# Patient Record
Sex: Male | Born: 1960
Health system: Southern US, Community
[De-identification: ages and names within clinical notes are randomized; demographics above are authoritative.]

## PROBLEM LIST (undated history)

## (undated) DIAGNOSIS — N181 Chronic kidney disease, stage 1: Secondary | ICD-10-CM

## (undated) DIAGNOSIS — I4729 Other ventricular tachycardia: Secondary | ICD-10-CM

## (undated) DIAGNOSIS — S39012A Strain of muscle, fascia and tendon of lower back, initial encounter: Secondary | ICD-10-CM

## (undated) DIAGNOSIS — J309 Allergic rhinitis, unspecified: Secondary | ICD-10-CM

## (undated) DIAGNOSIS — H409 Unspecified glaucoma: Secondary | ICD-10-CM

## (undated) DIAGNOSIS — H35719 Central serous chorioretinopathy, unspecified eye: Secondary | ICD-10-CM

## (undated) DIAGNOSIS — G5600 Carpal tunnel syndrome, unspecified upper limb: Secondary | ICD-10-CM

## (undated) DIAGNOSIS — R809 Proteinuria, unspecified: Secondary | ICD-10-CM

## (undated) DIAGNOSIS — M5432 Sciatica, left side: Secondary | ICD-10-CM

## (undated) DIAGNOSIS — R9431 Abnormal electrocardiogram [ECG] [EKG]: Secondary | ICD-10-CM

## (undated) DIAGNOSIS — I472 Ventricular tachycardia: Secondary | ICD-10-CM

## (undated) HISTORY — DX: Ventricular tachycardia: I47.2

## (undated) HISTORY — DX: Chronic kidney disease, stage 1: N18.1

## (undated) HISTORY — DX: Other ventricular tachycardia: I47.29

## (undated) HISTORY — DX: Central serous chorioretinopathy, unspecified eye: H35.719

## (undated) HISTORY — DX: Strain of muscle, fascia and tendon of lower back, initial encounter: S39.012A

## (undated) HISTORY — DX: Sciatica, left side: M54.32

## (undated) HISTORY — DX: Abnormal electrocardiogram (ECG) (EKG): R94.31

## (undated) HISTORY — DX: Allergic rhinitis, unspecified: J30.9

## (undated) HISTORY — DX: Proteinuria, unspecified: R80.9

## (undated) HISTORY — DX: Unspecified glaucoma: H40.9

## (undated) HISTORY — DX: Carpal tunnel syndrome, unspecified upper limb: G56.00

---

## 2013-06-11 HISTORY — PX: COLONOSCOPY: SHX174

## 2017-03-20 ENCOUNTER — Other Ambulatory Visit: Payer: Self-pay

## 2017-03-20 DIAGNOSIS — I10 Essential (primary) hypertension: Secondary | ICD-10-CM

## 2017-03-20 DIAGNOSIS — I517 Cardiomegaly: Secondary | ICD-10-CM

## 2017-03-20 DIAGNOSIS — R9431 Abnormal electrocardiogram [ECG] [EKG]: Secondary | ICD-10-CM

## 2017-03-20 HISTORY — DX: Cardiomegaly: I51.7

## 2017-03-20 HISTORY — DX: Essential (primary) hypertension: I10

## 2017-03-20 HISTORY — DX: Abnormal electrocardiogram (ECG) (EKG): R94.31

## 2017-03-23 ENCOUNTER — Ambulatory Visit: Payer: Federal, State, Local not specified - PPO | Admitting: Medical

## 2017-03-24 ENCOUNTER — Encounter: Payer: Self-pay | Admitting: Family Medicine

## 2017-03-24 ENCOUNTER — Ambulatory Visit (INDEPENDENT_AMBULATORY_CARE_PROVIDER_SITE_OTHER): Payer: Medicare Other | Admitting: Family Medicine

## 2017-03-24 VITALS — BP 122/78 | HR 45 | Temp 97.8°F | Ht 75.0 in | Wt 193.5 lb

## 2017-03-24 DIAGNOSIS — J4520 Mild intermittent asthma, uncomplicated: Secondary | ICD-10-CM

## 2017-03-24 NOTE — Patient Instructions (Addendum)
Pneumococcal vaccine (PCV23) is indicated for all people after turning 65. You would get it at this age because of your diagnosis of asthma.   Come to your next appointment fasting (9-12 hours).   Let us know if you need anything.

## 2017-03-24 NOTE — Progress Notes (Signed)
Chief Complaint  Patient presents with  . Establish Care       New Patient Visit SUBJECTIVE: HPI: Justin Frazier is an 57 y.o.male who is being seen for establishing care.  The patient was previously seen in [place].  He has a history of mild intermittent asthma and allergies.  He takes montelukast and albuterol as needed.  He ends up taking albuterol around once every 3 months.  He does not take any maintenance inhalers.  He has never had a pneumonia vaccine.  He does get the flu shot yearly.  He has a history of NSVT seen by the cardiology team.  His medicine is stable.  No Known Allergies  Past Medical History:  Diagnosis Date  . Abnormal EKG 03/20/2017  . Allergic rhinitis   . Carpal tunnel syndrome   . Central serous retinopathy   . CKD (chronic kidney disease), stage I   . Glaucoma   . Lumbar strain   . NSVT (nonsustained ventricular tachycardia) ([place])    Seen by Dr. Mina Marble (EP) recommended beta blocker therapy (2014)  . Proteinuria   . Sciatica of left side    Past Surgical History:  Procedure Laterality Date  . COLONOSCOPY  06/11/2013   Social History   Socioeconomic History  . Marital status: Married  Tobacco Use  . Smoking status: Never Smoker  . Smokeless tobacco: Never Used  Substance and Sexual Activity  . Alcohol use: No    Frequency: Never   History reviewed. No pertinent family history.   Current Outpatient Medications:  .  albuterol (PROVENTIL HFA;VENTOLIN HFA) 108 (90 Base) MCG/ACT inhaler, Inhale 2 puffs into the lungs every 6 (six) hours as needed. Every 4-6 hours as needed, Disp: , Rfl:  .  aspirin EC 81 MG tablet, Take 162 mg by mouth daily., Disp: , Rfl:  .  Cholecalciferol 5000 units TABS, Take 5,000 mg by mouth daily., Disp: , Rfl:  .  dorzolamide-timolol (COSOPT) 22.3-6.8 MG/ML ophthalmic solution, Place 1 drop into both eyes 2 (two) times daily., Disp: , Rfl:  .  lisinopril (PRINIVIL,ZESTRIL) 10 MG tablet, Take 10 mg by mouth daily., Disp: ,  Rfl:  .  metoprolol succinate (TOPROL-XL) 25 MG 24 hr tablet, Take 25 mg by mouth daily., Disp: , Rfl:  .  montelukast (SINGULAIR) 10 MG tablet, Take 10 mg by mouth at bedtime., Disp: , Rfl:  .  travoprost, benzalkonium, (TRAVATAN) 0.004 % ophthalmic solution, Place 1 drop into both eyes at bedtime., Disp: , Rfl:  .  traMADol (ULTRAM) 50 MG tablet, Take 2 tablets by mouth every 6 (six) hours as needed. Max of 4 tablets daily, Disp: , Rfl:  .  triamcinolone (NASACORT) 55 MCG/ACT AERO nasal inhaler, Place 2 sprays into the nose daily., Disp: , Rfl:   ROS Cardiovascular: Denies chest pain  Respiratory: Denies dyspnea   OBJECTIVE: BP 122/78 (BP Location: Left Arm, Patient Position: Sitting, Cuff Size: Large)   Pulse (!) 45   Temp 97.8 F (36.6 C) (Oral)   Ht 6\' 3"  (1.905 m)   Wt 193 lb 8 oz (87.8 kg)   SpO2 98%   BMI 24.19 kg/m   Constitutional: -  VS reviewed -  Well developed, well nourished, appears stated age -  No apparent distress  Psychiatric: -  Oriented to person, place, and time -  Memory intact -  Affect and mood normal -  Fluent conversation, good eye contact -  Judgment and insight age appropriate  Eye: -  Conjunctivae clear, no discharge -  Pupils symmetric, round, reactive to light  ENMT: -  MMM    Pharynx moist, no exudate, no erythema  Neck: -  No gross swelling, no palpable masses -  Thyroid midline, not enlarged, mobile, no palpable masses  Cardiovascular: -  RRR -  No LE edema  Respiratory: -  Normal respiratory effort, no accessory muscle use, no retraction -  Breath sounds equal, no wheezes, no ronchi, no crackles  Skin: -  No significant lesion on inspection -  Warm and dry to palpation   ASSESSMENT/PLAN: Mild intermittent asthma without complication   Patient instructed to sign release of records form from his previous PCP. Continue current management.  Recommended immunization with pneumococcal vaccine.  The patient would like to research it before  agreeing to this.   Patient should return at his earliest convenience for CPE, fasting. The patient voiced understanding and agreement to the plan.   [place], DO 03/24/17  9:53 AM

## 2017-03-24 NOTE — Progress Notes (Signed)
Pre visit review using our clinic review tool, if applicable. No additional management support is needed unless otherwise documented below in the visit note. 

## 2017-03-27 ENCOUNTER — Ambulatory Visit: Payer: Federal, State, Local not specified - PPO | Admitting: Cardiology

## 2017-03-30 ENCOUNTER — Encounter: Payer: Self-pay | Admitting: Family Medicine

## 2017-03-30 ENCOUNTER — Ambulatory Visit (INDEPENDENT_AMBULATORY_CARE_PROVIDER_SITE_OTHER): Payer: Medicare Other | Admitting: Cardiology

## 2017-03-30 ENCOUNTER — Encounter: Payer: Self-pay | Admitting: Cardiology

## 2017-03-30 ENCOUNTER — Ambulatory Visit (INDEPENDENT_AMBULATORY_CARE_PROVIDER_SITE_OTHER): Payer: Medicare Other | Admitting: Family Medicine

## 2017-03-30 ENCOUNTER — Other Ambulatory Visit: Payer: Self-pay | Admitting: Family Medicine

## 2017-03-30 VITALS — BP 128/82 | HR 48 | Temp 97.6°F | Ht 75.0 in | Wt 192.2 lb

## 2017-03-30 VITALS — BP 128/72 | HR 44 | Ht 75.0 in | Wt 192.0 lb

## 2017-03-30 DIAGNOSIS — R9431 Abnormal electrocardiogram [ECG] [EKG]: Secondary | ICD-10-CM

## 2017-03-30 DIAGNOSIS — I10 Essential (primary) hypertension: Secondary | ICD-10-CM | POA: Diagnosis not present

## 2017-03-30 DIAGNOSIS — I517 Cardiomegaly: Secondary | ICD-10-CM | POA: Diagnosis not present

## 2017-03-30 DIAGNOSIS — I472 Ventricular tachycardia: Secondary | ICD-10-CM

## 2017-03-30 DIAGNOSIS — Z125 Encounter for screening for malignant neoplasm of prostate: Secondary | ICD-10-CM

## 2017-03-30 DIAGNOSIS — Z0000 Encounter for general adult medical examination without abnormal findings: Secondary | ICD-10-CM

## 2017-03-30 DIAGNOSIS — D696 Thrombocytopenia, unspecified: Secondary | ICD-10-CM

## 2017-03-30 DIAGNOSIS — I4729 Other ventricular tachycardia: Secondary | ICD-10-CM | POA: Insufficient documentation

## 2017-03-30 DIAGNOSIS — D72819 Decreased white blood cell count, unspecified: Secondary | ICD-10-CM

## 2017-03-30 DIAGNOSIS — R0600 Dyspnea, unspecified: Secondary | ICD-10-CM

## 2017-03-30 DIAGNOSIS — R0609 Other forms of dyspnea: Secondary | ICD-10-CM

## 2017-03-30 HISTORY — DX: Other forms of dyspnea: R06.09

## 2017-03-30 HISTORY — DX: Encounter for general adult medical examination without abnormal findings: Z00.00

## 2017-03-30 HISTORY — DX: Dyspnea, unspecified: R06.00

## 2017-03-30 LAB — PSA: PSA: 2.08 ng/mL (ref 0.10–4.00)

## 2017-03-30 LAB — POC URINALSYSI DIPSTICK (AUTOMATED)
Bilirubin, UA: NEGATIVE
Glucose, UA: NEGATIVE
Ketones, UA: NEGATIVE
Leukocytes, UA: NEGATIVE
NITRITE UA: NEGATIVE
PH UA: 6 (ref 5.0–8.0)
PROTEIN UA: NEGATIVE
RBC UA: NEGATIVE
Urobilinogen, UA: 0.2 E.U./dL

## 2017-03-30 LAB — CBC
HCT: 44.6 % (ref 39.0–52.0)
HEMOGLOBIN: 14.9 g/dL (ref 13.0–17.0)
MCHC: 33.3 g/dL (ref 30.0–36.0)
MCV: 94 fl (ref 78.0–100.0)
Platelets: 125 10*3/uL — ABNORMAL LOW (ref 150.0–400.0)
RBC: 4.75 Mil/uL (ref 4.22–5.81)
RDW: 13.5 % (ref 11.5–15.5)
WBC: 3.8 10*3/uL — ABNORMAL LOW (ref 4.0–10.5)

## 2017-03-30 LAB — COMPREHENSIVE METABOLIC PANEL
ALBUMIN: 4.1 g/dL (ref 3.5–5.2)
ALT: 7 U/L (ref 0–53)
AST: 15 U/L (ref 0–37)
Alkaline Phosphatase: 57 U/L (ref 39–117)
BILIRUBIN TOTAL: 0.8 mg/dL (ref 0.2–1.2)
BUN: 14 mg/dL (ref 6–23)
CO2: 28 mEq/L (ref 19–32)
CREATININE: 1.26 mg/dL (ref 0.40–1.50)
Calcium: 8.9 mg/dL (ref 8.4–10.5)
Chloride: 107 mEq/L (ref 96–112)
GFR: 75.91 mL/min (ref >60.00)
Glucose, Bld: 95 mg/dL (ref 70–99)
Potassium: 3.8 mEq/L (ref 3.5–5.1)
Sodium: 141 mEq/L (ref 135–145)
Total Protein: 6.8 g/dL (ref 6.0–8.3)

## 2017-03-30 LAB — LIPID PANEL
CHOLESTEROL: 174 mg/dL (ref 0–200)
HDL: 42.5 mg/dL (ref >39.00)
LDL Cholesterol: 113 mg/dL — ABNORMAL HIGH (ref 0–99)
NONHDL: 131.48
Total CHOL/HDL Ratio: 4
Triglycerides: 90 mg/dL (ref 0.0–149.0)
VLDL: 18 mg/dL (ref 0.0–40.0)

## 2017-03-30 MED ORDER — TRAMADOL HCL 50 MG PO TABS: 100.0000 mg | ORAL_TABLET | Freq: Four times a day (QID) | ORAL | 0 refills | Status: DC | PRN

## 2017-03-30 MED ORDER — LISINOPRIL 10 MG PO TABS: 10.0000 mg | ORAL_TABLET | Freq: Every day | ORAL | 3 refills | Status: DC

## 2017-03-30 NOTE — Patient Instructions (Signed)
Medication Instructions:  Your physician recommends that you continue on your current medications as directed. Please refer to the Current Medication list given to you today.  Labwork: None  Testing/Procedures: Your physician has recommended that you wear a holter monitor. Holter monitors are medical devices that record the heart's electrical activity. Doctors most often use these monitors to diagnose arrhythmias. Arrhythmias are problems with the speed or rhythm of the heartbeat. The monitor is a small, portable device. You can wear one while you do your normal daily activities. This is usually used to diagnose what is causing palpitations/syncope (passing out).  Your physician has requested that you have an echocardiogram. Echocardiography is a painless test that uses sound waves to create images of your heart. It provides your doctor with information about the size and shape of your heart and how well your heart's chambers and valves are working. This procedure takes approximately one hour. There are no restrictions for this procedure.  Your physician has requested that you have a lexiscan myoview. For further information please visit [email or web address]. Please follow instruction sheet, as given.  Follow-Up: Your physician recommends that you schedule a follow-up appointment in: 1 month  Any Other Special Instructions Will Be Listed Below (If Applicable).     If you need a refill on your cardiac medications before your next appointment, please call your pharmacy.   [place], RN, BSN

## 2017-03-30 NOTE — Patient Instructions (Addendum)
Aim to do some physical exertion for 150 minutes per week. This is typically divided into 5 days per week, 30 minutes per day. The activity should be enough to get your heart rate up. Anything is better than nothing if you have time constraints.  Check out Yoga on Youtube to help with your back and general health. Try to do it 3 times per week.   Give [place] 1-2 days to get the results of your labs back.

## 2017-03-30 NOTE — Progress Notes (Signed)
Pre visit review using our clinic review tool, if applicable. No additional management support is needed unless otherwise documented below in the visit note. 

## 2017-03-30 NOTE — Addendum Note (Signed)
Addended by: Caffie Pinto on: 03/30/2017 12:26 PM   Modules accepted: Orders

## 2017-03-30 NOTE — Progress Notes (Signed)
Chief Complaint  Patient presents with  . Annual Exam    Well Male Justin Frazier is here for a complete physical.   His last physical was >1 year ago.  Current diet: in general, a "healthy" diet   Current exercise: walking Weight trend: stable Does pt snore? Sometimes. Daytime fatigue? No.   Seat belt? Yes.    Health maintenance Colonoscopy- Yes - 2 years ago Tetanus- Yes HIV- Yes - Summer Hep C- Yes - Summer Prostate cancer screening- Yes   Past Medical History:  Diagnosis Date  . Abnormal EKG 03/20/2017  . Allergic rhinitis   . Carpal tunnel syndrome   . Central serous retinopathy   . CKD (chronic kidney disease), stage I   . Glaucoma   . Lumbar strain   . NSVT (nonsustained ventricular tachycardia) ([name])    Seen by Dr. Mina Marble (EP) recommended beta blocker therapy (2014)  . Proteinuria   . Sciatica of left side     Past Surgical History:  Procedure Laterality Date  . COLONOSCOPY  06/11/2013   Medications  Current Outpatient Medications on File Prior to Visit  Medication Sig Dispense Refill  . albuterol (PROVENTIL HFA;VENTOLIN HFA) 108 (90 Base) MCG/ACT inhaler Inhale 2 puffs into the lungs every 6 (six) hours as needed. Every 4-6 hours as needed    . aspirin EC 81 MG tablet Take 162 mg by mouth daily.    . Cholecalciferol 5000 units TABS Take 5,000 mg by mouth daily.    . dorzolamide-timolol (COSOPT) 22.3-6.8 MG/ML ophthalmic solution Place 1 drop into both eyes 2 (two) times daily.    Marland Kitchen lisinopril (PRINIVIL,ZESTRIL) 10 MG tablet Take 10 mg by mouth daily.    . metoprolol succinate (TOPROL-XL) 25 MG 24 hr tablet Take 25 mg by mouth daily.    . montelukast (SINGULAIR) 10 MG tablet Take 10 mg by mouth at bedtime.    . traMADol (ULTRAM) 50 MG tablet Take 2 tablets by mouth every 6 (six) hours as needed. Max of 4 tablets daily    . travoprost, benzalkonium, (TRAVATAN) 0.004 % ophthalmic solution Place 1 drop into both eyes at bedtime.    . triamcinolone (NASACORT) 55  MCG/ACT AERO nasal inhaler Place 2 sprays into the nose daily.     Allergies No Known Allergies Family History History reviewed. No pertinent family history.  Review of Systems: Constitutional:  no fevers or chills Eye:  no recent significant change in vision Ear/Nose/Mouth/Throat:  Ears:  no hearing loss Nose/Mouth/Throat:  no complaints of nasal congestion or bleeding, no sore throat Cardiovascular:  no chest pain, no palpitations Respiratory:  no cough and no shortness of breath Gastrointestinal:  no abdominal pain, no change in bowel habits, no nausea, vomiting, diarrhea, or constipation and no black or bloody stool GU:  Male: negative for dysuria, frequency, and incontinence and negative for prostate symptoms Musculoskeletal/Extremities:  no pain, redness, or swelling of the joints Integumentary (Skin/Breast):  no abnormal skin lesions reported Neurologic:  no headaches Endocrine: No unexpected weight changes Hematologic/Lymphatic:  no abnormal bleeding  Exam BP 128/82 (BP Location: Left Arm, Patient Position: Sitting, Cuff Size: Normal)   Pulse (!) 48   Temp 97.6 F (36.4 C) (Oral)   Ht 6\' 3"  (1.905 m)   Wt 192 lb 4 oz (87.2 kg)   SpO2 98%   BMI 24.03 kg/m  General:  well developed, well nourished, in no apparent distress Skin:  no significant moles, warts, or growths Head:  no masses, lesions,  or tenderness Eyes:  pupils equal and round, sclera anicteric without injection Ears:  canals without lesions, TMs shiny without retraction, no obvious effusion, no erythema Nose:  nares patent, septum midline, mucosa normal Throat/Pharynx:  lips and gingiva without lesion; tongue and uvula midline; non-inflamed pharynx; no exudates or postnasal drainage Neck: neck supple without adenopathy, thyromegaly, or masses Lungs:  clear to auscultation, breath sounds equal bilaterally, no respiratory distress Cardio:  regular rate and rhythm without murmurs, heart sounds without clicks or  rubs Abdomen:  abdomen soft, nontender; bowel sounds normal; no masses or organomegaly Genital (male): circumcised penis, no lesions or discharge; testes present bilaterally without masses or tenderness Rectal: Deferred Musculoskeletal:  symmetrical muscle groups noted without atrophy or deformity Extremities:  no clubbing, cyanosis, or edema, no deformities, no skin discoloration Neuro:  gait normal; deep tendon reflexes normal and symmetric Psych: well oriented with normal range of affect and appropriate judgment/insight  Assessment and Plan  Well adult exam - Plan: Lipid panel, CBC, Comprehensive metabolic panel, Urinalysis, Routine w reflex microscopic  Screening for malignant neoplasm of prostate - Plan: PSA   Well 57 y.o. male. Counseled on diet and exercise. Yoga.  Counseled on risks and benefits of prostate cancer screening with PSA. The patient agrees to undergo testing. Immunizations, labs, and further orders as above. Follow up in 1 yr or prn. The patient voiced understanding and agreement to the plan.  [place], DO 03/30/17 8:23 AM

## 2017-03-30 NOTE — Progress Notes (Signed)
Cardiology Office Note:    Date:  03/30/2017   ID:  Justin Frazier, DOB [birth year], MRN [id number]  PCP:  [name], DO  Cardiologist:  Jenean Lindau, MD   Referring MD: No ref. provider found    ASSESSMENT:    1. Hypertension, unspecified type   2. NSVT (nonsustained ventricular tachycardia) ([place])   3. LVH (left ventricular hypertrophy)   4. Abnormal EKG    PLAN:    In order of problems listed above:  1. Primary prevention stressed with the patient.  Importance of compliance with diet and medications stressed and he vocalized understanding.  He has had all blood work done recently and we will try to evaluate it when we have the results.  Hopefully this will be at his next appointment in a month.\ 2. Patient has abnormal EKG and history of nonsustained ventricular tachycardia.  We will do a echocardiogram and exercise Cardiolite.  He has been advised to withhold his beta-blocker on the morning of the test.  48-hour Holter monitoring will be done in view of history of nonsustained ventricular tachycardia.  Patient has never had symptoms of sustained palpitations or syncope. 3. 1 month follow-up appointment or earlier if he has any concerns.   Medication Adjustments/Labs and Tests Ordered: Current medicines are reviewed at length with the patient today.  Concerns regarding medicines are outlined above.  Orders Placed This Encounter  Procedures  . HOLTER MONITOR - 48 HOUR  . MYOCARDIAL PERFUSION IMAGING  . EKG 12-Lead  . ECHOCARDIOGRAM COMPLETE   No orders of the defined types were placed in this encounter.    History of Present Illness:    Justin Frazier is a 57 y.o. male who is being seen today for the evaluation of abnormal EKG, history of left ventricular hypertrophy and nonsustained ventricular tachycardia at the request of her primary care physician.  Patient is a pleasant 57 year old male.  He has past medical history of essential hypertension.  He  is retired and now leads a sedentary lifestyle.  No chest pain orthopnea or PND.  His EKG is markedly abnormal with history of an abnormal EKG in the past.  He has left ventricular hypertrophy on the EKG.  He has history of nonsustained ventricular tachycardia.  He leads a sedentary lifestyle.  He is evaluated for the same at this time.  He denies any history of palpitations or syncope.  Only on and off he feels a skipped beat which is been going on for the past several years.  At the time of my evaluation, the patient is alert awake oriented and in no distress.  Past Medical History:  Diagnosis Date  . Abnormal EKG 03/20/2017  . Allergic rhinitis   . Carpal tunnel syndrome   . Central serous retinopathy   . CKD (chronic kidney disease), stage I   . Glaucoma   . Lumbar strain   . NSVT (nonsustained ventricular tachycardia) ([place])    Seen by Dr. Mina Marble (EP) recommended beta blocker therapy (2014)  . Proteinuria   . Sciatica of left side     Past Surgical History:  Procedure Laterality Date  . COLONOSCOPY  06/11/2013    Current Medications: Current Meds  Medication Sig  . albuterol (PROVENTIL HFA;VENTOLIN HFA) 108 (90 Base) MCG/ACT inhaler Inhale 2 puffs into the lungs every 6 (six) hours as needed. Every 4-6 hours as needed  . aspirin EC 81 MG tablet Take 162 mg by mouth daily.  . Cholecalciferol 5000 units  TABS Take 5,000 mg by mouth daily.  . dorzolamide-timolol (COSOPT) 22.3-6.8 MG/ML ophthalmic solution Place 1 drop into both eyes 2 (two) times daily.  Marland Kitchen lisinopril (PRINIVIL,ZESTRIL) 10 MG tablet Take 1 tablet (10 mg total) by mouth daily.  . metoprolol succinate (TOPROL-XL) 25 MG 24 hr tablet Take 25 mg by mouth daily.  . montelukast (SINGULAIR) 10 MG tablet Take 10 mg by mouth at bedtime.  . traMADol (ULTRAM) 50 MG tablet Take 2 tablets (100 mg total) by mouth every 6 (six) hours as needed. Max of 4 tablets daily  . travoprost, benzalkonium, (TRAVATAN) 0.004 % ophthalmic solution  Place 1 drop into both eyes at bedtime.  . triamcinolone (NASACORT) 55 MCG/ACT AERO nasal inhaler Place 2 sprays into the nose daily.     Allergies:   Patient has no known allergies.   Social History   Socioeconomic History  . Marital status: Married    Spouse name: None  . Number of children: None  . Years of education: None  . Highest education level: None  Social Needs  . Financial resource strain: None  . Food insecurity - worry: None  . Food insecurity - inability: None  . Transportation needs - medical: None  . Transportation needs - non-medical: None  Occupational History  . None  Tobacco Use  . Smoking status: Never Smoker  . Smokeless tobacco: Never Used  Substance and Sexual Activity  . Alcohol use: No    Frequency: Never  . Drug use: None  . Sexual activity: None  Other Topics Concern  . None  Social History Narrative  . None     Family History: The patient's family history is not on file.  ROS:   Please see the history of present illness.    All other systems reviewed and are negative.  EKGs/Labs/Other Studies Reviewed:    The following studies were reviewed today: EKG done revealed sinus rhythm and left ventricular hypertrophy.  Records from his primary care doctor and other cardiology evaluation in the past were reviewed.   Recent Labs: No results found for requested labs within last 8760 hours.  Recent Lipid Panel No results found for: CHOL, TRIG, HDL, CHOLHDL, VLDL, LDLCALC, LDLDIRECT  Physical Exam:    VS:  BP 128/72 (BP Location: Right Arm, Patient Position: Sitting, Cuff Size: Normal)   Pulse (!) 44   Ht 6\' 3"  (1.905 m)   Wt 192 lb (87.1 kg)   SpO2 98%   BMI 24.00 kg/m     Wt Readings from Last 3 Encounters:  03/30/17 192 lb (87.1 kg)  03/30/17 192 lb 4 oz (87.2 kg)  03/24/17 193 lb 8 oz (87.8 kg)     GEN: Patient is in no acute distress HEENT: Normal NECK: No JVD; No carotid bruits LYMPHATICS: No lymphadenopathy CARDIAC: S1  S2 regular, 2/6 systolic murmur at the apex. RESPIRATORY:  Clear to auscultation without rales, wheezing or rhonchi  ABDOMEN: Soft, non-tender, non-distended MUSCULOSKELETAL:  No edema; No deformity  SKIN: Warm and dry NEUROLOGIC:  Alert and oriented x 3 PSYCHIATRIC:  Normal affect    Signed, Jenean Lindau, MD  03/30/2017 10:37 AM    [place]

## 2017-03-30 NOTE — Addendum Note (Signed)
Addended by: Sharon Seller B on: 03/30/2017 08:38 AM   Modules accepted: Orders

## 2017-03-31 ENCOUNTER — Ambulatory Visit: Payer: Federal, State, Local not specified - PPO | Admitting: Cardiology

## 2017-03-31 ENCOUNTER — Other Ambulatory Visit (INDEPENDENT_AMBULATORY_CARE_PROVIDER_SITE_OTHER): Payer: Medicare Other

## 2017-03-31 DIAGNOSIS — D691 Qualitative platelet defects: Secondary | ICD-10-CM | POA: Diagnosis not present

## 2017-03-31 DIAGNOSIS — D72819 Decreased white blood cell count, unspecified: Secondary | ICD-10-CM | POA: Diagnosis not present

## 2017-03-31 DIAGNOSIS — D696 Thrombocytopenia, unspecified: Secondary | ICD-10-CM | POA: Diagnosis not present

## 2017-03-31 NOTE — Addendum Note (Signed)
Addended by: Harl Bowie on: 03/31/2017 07:57 AM   Modules accepted: Orders

## 2017-04-03 ENCOUNTER — Other Ambulatory Visit: Payer: Medicare Other

## 2017-04-03 LAB — CBC WITH DIFFERENTIAL/PLATELET
Basophils Absolute: 22 cells/uL (ref 0–200)
Basophils Relative: 0.5 %
EOS PCT: 1.1 %
Eosinophils Absolute: 48 cells/uL (ref 15–500)
HEMATOCRIT: 43.9 % (ref 38.5–50.0)
Hemoglobin: 14.6 g/dL (ref 13.2–17.1)
LYMPHS ABS: 1751 {cells}/uL (ref 850–3900)
MCH: 30.2 pg (ref 27.0–33.0)
MCHC: 33.3 g/dL (ref 32.0–36.0)
MCV: 90.7 fL (ref 80.0–100.0)
MONOS PCT: 9 %
MPV: 11.8 fL (ref 7.5–12.5)
NEUTROS ABS: 2182 {cells}/uL (ref 1500–7800)
NEUTROS PCT: 49.6 %
PLATELETS: 131 10*3/uL — AB (ref 140–400)
RBC: 4.84 10*6/uL (ref 4.20–5.80)
RDW: 12.5 % (ref 11.0–15.0)
Total Lymphocyte: 39.8 %
WBC mixed population: 396 cells/uL (ref 200–950)
WBC: 4.4 10*3/uL (ref 3.8–10.8)

## 2017-04-03 LAB — PATHOLOGIST SMEAR REVIEW

## 2017-04-04 ENCOUNTER — Encounter: Payer: Self-pay | Admitting: Family Medicine

## 2017-04-05 ENCOUNTER — Other Ambulatory Visit: Payer: Self-pay | Admitting: Family Medicine

## 2017-04-05 ENCOUNTER — Telehealth (HOSPITAL_COMMUNITY): Payer: Self-pay | Admitting: *Deleted

## 2017-04-05 DIAGNOSIS — D691 Qualitative platelet defects: Secondary | ICD-10-CM

## 2017-04-05 NOTE — Telephone Encounter (Signed)
Patient given detailed instructions per Myocardial Perfusion Study Information Sheet for the test on 04/11/17 Patient notified to arrive 15 minutes early and that it is imperative to arrive on time for appointment to keep from having the test rescheduled.  If you need to cancel or reschedule your appointment, please call the office within 24 hours of your appointment. . Patient verbalized understanding.Justin Frazier Jacqueline    

## 2017-04-10 ENCOUNTER — Telehealth: Payer: Self-pay | Admitting: *Deleted

## 2017-04-10 NOTE — Telephone Encounter (Signed)
Received Medical records from [facility], [place]; forwarded to provider/SLS 02/25

## 2017-04-11 ENCOUNTER — Ambulatory Visit (INDEPENDENT_AMBULATORY_CARE_PROVIDER_SITE_OTHER): Payer: Medicare Other

## 2017-04-11 ENCOUNTER — Other Ambulatory Visit: Payer: Self-pay

## 2017-04-11 ENCOUNTER — Ambulatory Visit (HOSPITAL_BASED_OUTPATIENT_CLINIC_OR_DEPARTMENT_OTHER): Payer: Medicare Other

## 2017-04-11 ENCOUNTER — Ambulatory Visit (HOSPITAL_COMMUNITY): Payer: Medicare Other | Attending: Cardiovascular Disease

## 2017-04-11 DIAGNOSIS — I080 Rheumatic disorders of both mitral and aortic valves: Secondary | ICD-10-CM | POA: Insufficient documentation

## 2017-04-11 DIAGNOSIS — I10 Essential (primary) hypertension: Secondary | ICD-10-CM

## 2017-04-11 DIAGNOSIS — I472 Ventricular tachycardia: Secondary | ICD-10-CM

## 2017-04-11 DIAGNOSIS — I4729 Other ventricular tachycardia: Secondary | ICD-10-CM

## 2017-04-11 LAB — MYOCARDIAL PERFUSION IMAGING
CHL CUP NUCLEAR SSS: 2
CHL CUP RESTING HR STRESS: 39 {beats}/min
CHL RATE OF PERCEIVED EXERTION: 18
CSEPED: 9 min
Estimated workload: 10.1 METS
LV dias vol: 141 mL (ref 62–150)
LV sys vol: 75 mL
MPHR: 164 {beats}/min
Peak HR: 151 {beats}/min
Percent HR: 92 %
RATE: 0.26
SDS: 2
SRS: 0
TID: 1.01

## 2017-04-11 MED ORDER — TECHNETIUM TC 99M TETROFOSMIN IV KIT
10.2000 | PACK | Freq: Once | INTRAVENOUS | Status: AC | PRN
  Administered 2017-04-11: 10.2 via INTRAVENOUS
  Filled 2017-04-11: qty 11

## 2017-04-11 MED ORDER — TECHNETIUM TC 99M TETROFOSMIN IV KIT
32.6000 | PACK | Freq: Once | INTRAVENOUS | Status: AC | PRN
  Administered 2017-04-11: 32.6 via INTRAVENOUS
  Filled 2017-04-11: qty 33

## 2017-04-11 MED ORDER — PERFLUTREN LIPID MICROSPHERE
1.0000 mL | INTRAVENOUS | Status: AC | PRN
  Administered 2017-04-11: 2 mL via INTRAVENOUS

## 2017-04-13 ENCOUNTER — Telehealth: Payer: Self-pay | Admitting: Family Medicine

## 2017-04-13 NOTE — Telephone Encounter (Signed)
01/2016 CBC- WBC's WNL (4.4k), Plt low 129k, Hb WNL 15 g/dL Tdap 02/01/16  OK to recheck as originally planned for now. If WBC is abn, will refer.

## 2017-04-21 ENCOUNTER — Other Ambulatory Visit: Payer: Self-pay

## 2017-04-21 DIAGNOSIS — I472 Ventricular tachycardia, unspecified: Secondary | ICD-10-CM

## 2017-04-27 ENCOUNTER — Ambulatory Visit: Payer: Medicare Other | Admitting: Cardiology

## 2017-05-30 ENCOUNTER — Encounter: Payer: Self-pay | Admitting: Cardiology

## 2017-06-09 ENCOUNTER — Ambulatory Visit (HOSPITAL_COMMUNITY)
Admission: RE | Admit: 2017-06-09 | Discharge: 2017-06-09 | Disposition: A | Discharge status: Home / Self Care | Payer: Medicare Other | Source: Ambulatory Visit | Attending: Cardiology | Admitting: Cardiology

## 2017-06-09 DIAGNOSIS — I472 Ventricular tachycardia: Secondary | ICD-10-CM | POA: Diagnosis not present

## 2017-06-09 DIAGNOSIS — I080 Rheumatic disorders of both mitral and aortic valves: Secondary | ICD-10-CM | POA: Insufficient documentation

## 2017-06-09 DIAGNOSIS — I517 Cardiomegaly: Secondary | ICD-10-CM | POA: Insufficient documentation

## 2017-06-09 DIAGNOSIS — R9431 Abnormal electrocardiogram [ECG] [EKG]: Secondary | ICD-10-CM | POA: Diagnosis not present

## 2017-06-09 LAB — CREATININE, SERUM
CREATININE: 1.27 mg/dL — AB (ref 0.61–1.24)
GFR calc Af Amer: >60 mL/min (ref >60)

## 2017-06-09 MED ORDER — GADOBENATE DIMEGLUMINE 529 MG/ML IV SOLN
30.0000 mL | Freq: Once | INTRAVENOUS | Status: AC | PRN
  Administered 2017-06-09: 30 mL via INTRAVENOUS

## 2017-06-14 ENCOUNTER — Other Ambulatory Visit: Payer: Self-pay

## 2017-06-14 DIAGNOSIS — I517 Cardiomegaly: Secondary | ICD-10-CM

## 2017-06-22 ENCOUNTER — Institutional Professional Consult (permissible substitution): Payer: Medicare Other | Admitting: Internal Medicine

## 2017-06-27 ENCOUNTER — Encounter: Payer: Self-pay | Admitting: Cardiology

## 2017-06-27 ENCOUNTER — Ambulatory Visit (INDEPENDENT_AMBULATORY_CARE_PROVIDER_SITE_OTHER): Payer: Medicare Other | Admitting: Cardiology

## 2017-06-27 ENCOUNTER — Encounter (INDEPENDENT_AMBULATORY_CARE_PROVIDER_SITE_OTHER): Payer: Self-pay

## 2017-06-27 ENCOUNTER — Other Ambulatory Visit: Payer: Self-pay | Admitting: Family Medicine

## 2017-06-27 VITALS — BP 136/92 | HR 51 | Ht 75.0 in | Wt 187.0 lb

## 2017-06-27 DIAGNOSIS — I493 Ventricular premature depolarization: Secondary | ICD-10-CM

## 2017-06-27 DIAGNOSIS — I10 Essential (primary) hypertension: Secondary | ICD-10-CM | POA: Diagnosis not present

## 2017-06-27 DIAGNOSIS — I422 Other hypertrophic cardiomyopathy: Secondary | ICD-10-CM

## 2017-06-27 MED ORDER — METOPROLOL SUCCINATE ER 25 MG PO TB24: 25.0000 mg | ORAL_TABLET | Freq: Every day | ORAL | 2 refills | Status: DC

## 2017-06-27 NOTE — Patient Instructions (Signed)
Medication Instructions:  Your physician recommends that you continue on your current medications as directed. Please refer to the Current Medication list given to you today.  Labwork: None ordered  Testing/Procedures: None ordered  Follow-Up: You have been referred to our genetic counselor, Dr. Broadus John  Your physician recommends that you schedule a follow-up appointment in: 3 months with Dr. Curt Bears.   * If you need a refill on your cardiac medications before your next appointment, please call your pharmacy.   *Please note that any paperwork needing to be filled out by the provider will need to be addressed at the front desk prior to seeing the provider. Please note that any FMLA, disability or other documents regarding health condition is subject to a $25.00 charge that must be received prior to completion of paperwork in the form of a money order or check.  Thank you for choosing [facility]!!   Trinidad Curet, RN [phone]  Any Other Special Instructions Will Be Listed Below (If Applicable).   Hypertrophic Cardiomyopathy Hypertrophic cardiomyopathy (HCM) is a heart condition in which part of the heart muscle gets too thick. The condition can cause a dangerous and abnormal heart rhythm. It can also weaken the heart over time. The heart is divided into four chambers. The thickening usually affects the pumping chamber on the lower left (left ventricle). HCM may also affect the valve that lets blood flow out of the left ventricle (mitral valve). Symptoms often begin at about age 61. What are the causes? This condition is usually caused by abnormal genes that control heart muscle growth. These abnormal genes are passed down through families (are inherited). What increases the risk? This condition is more likely to develop in people with a family history of HCM. What are the signs or symptoms? Symptoms of this condition include:  Shortness of breath, especially after  exercising or lying down.  Chest pain.  Dizziness.  Fatigue.  Irregular or fast heart rate.  Fainting, especially after physical activity.  How is this diagnosed? This condition is diagnosed based on the results of a physical exam that involves checking for an abnormal heart sound (heart murmur). Tests may be done, including:  An electrocardiogram (ECG). This test records your heart's electrical activity.  An echocardiogram. This test can show whether your left ventricle is enlarged and whether it fills slowly.  A Doppler test. This test shows irregular blood flow and pressure differences inside the heart.  A chest X-ray. This test can show whether your heart is enlarged.  An MRI.  How is this treated? Treatment for HCM depends on how severe your symptoms are. There are several options for treatment, including.  Medicine. Medicines can be given to: ? Reduce the workload of your heart. ? Lower your blood pressure. ? Thin your blood and prevent clots.  A device. Devices that can be used to treat this condition include: ? A pacemaker. This device helps to control your heartbeat. ? A defibrillator. This device restores a normal heart rhythm.  Surgery. This may include a procedure to: ? Inject alcohol into the small blood vessels that supply your heart muscle (alcohol septal ablation). This is done during a procedure called cardiac catheterization. The goal is to cause the muscle to become thinner. ? Remove part of the wall that divides the right and left sides of the heart (septum). ? Replace the mitral valve.  Follow these instructions at home:  Avoid strenuous exercise and activities, like heavy lifting or shoveling snow.  Make sure the members of your household know how to do CPR in case of an emergency.  Take over-the-counter and prescription medicines only as told by your health care provider.  Follow a healthy diet and maintain a healthy weight. If you need help  with losing weight, ask your health care provider.  Limit alcohol intake to no more than 1 drink per day for nonpregnant women and 2 drinks per day for men. One drink equals 12 oz of beer, 5 oz of wine, or 1 oz of hard liquor.  Do not use any products that contain nicotine or tobacco, such as cigarettes and e-cigarettes. If you need help quitting, ask your health care provider.  Keep all follow-up visits as directed by your health care provider. This is important. Contact a health care provider if:  You have new symptoms.  Your symptoms get worse. Get help right away if:  You have chest pain or shortness of breath, especially during or after sports.  You feel faint or you pass out.  You have trouble breathing even at rest.  Your feet or ankles swell.  Your heartbeat seems irregular or seems faster than normal (palpitations).  Your heartbeat seems abnormal. This information is not intended to replace advice given to you by your health care provider. Make sure you discuss any questions you have with your health care provider. Document Released: 01/07/2004 Document Revised: 12/25/2015 Document Reviewed: 12/25/2015 Elsevier Interactive Patient Education  2018 [employer].

## 2017-06-27 NOTE — Progress Notes (Signed)
Electrophysiology Office Note   Date:  06/27/2017   ID:  [name], DOB 1960-11-01, MRN [id number]  PCP:  [name], DO  Cardiologist:  Revankar Primary Electrophysiologist:  Jmarion Christiano Meredith Leeds, MD    Chief Complaint  Patient presents with  . Advice Only    NSVT     History of Present Illness: HASHIM EICHHORST is a 57 y.o. male who is being seen today for the evaluation of hypertrophic cardia myopathy at the request of [name]. Presenting today for electrophysiology evaluation.  He has a history of CKD stage I, hypertension, LVH, and nonsustained VT on beta-blockers.  He has occasional palpitations on and off for the last several years.  He stopped caffeine and his palpitations went away.  He has not had any episodes of syncope, near syncope.  He does not have a family history of sudden death.  Wore a cardiac monitor that did not show sustained arrhythmias.  He does not have an LV apical aneurysm.  He does have a daughter.    Today, he denies symptoms of palpitations, chest pain, shortness of breath, orthopnea, PND, lower extremity edema, claudication, dizziness, presyncope, syncope, bleeding, or neurologic sequela. The patient is tolerating medications without difficulties.    Past Medical History:  Diagnosis Date  . Abnormal EKG 03/20/2017  . Allergic rhinitis   . Carpal tunnel syndrome   . Central serous retinopathy   . CKD (chronic kidney disease), stage I   . Glaucoma   . Lumbar strain   . NSVT (nonsustained ventricular tachycardia) ([place])    Seen by Dr. Mina Marble (EP) recommended beta blocker therapy (2014)  . Proteinuria   . Sciatica of left side    Past Surgical History:  Procedure Laterality Date  . COLONOSCOPY  06/11/2013     Current Outpatient Medications  Medication Sig Dispense Refill  . albuterol (PROVENTIL HFA;VENTOLIN HFA) 108 (90 Base) MCG/ACT inhaler Inhale 2 puffs into the lungs every 6 (six) hours as needed. Every 4-6 hours  as needed    . aspirin EC 81 MG tablet Take 162 mg by mouth daily.    . Cholecalciferol 5000 units TABS Take 5,000 mg by mouth daily.    . dorzolamide-timolol (COSOPT) 22.3-6.8 MG/ML ophthalmic solution Place 1 drop into both eyes 2 (two) times daily.    Marland Kitchen lisinopril (PRINIVIL,ZESTRIL) 10 MG tablet Take 1 tablet (10 mg total) by mouth daily. 90 tablet 3  . metoprolol succinate (TOPROL-XL) 25 MG 24 hr tablet Take 25 mg by mouth daily.    . montelukast (SINGULAIR) 10 MG tablet Take 10 mg by mouth at bedtime.    . traMADol (ULTRAM) 50 MG tablet Take 50 mg by mouth every 6 (six) hours as needed. Max of 4 tablets daily    . travoprost, benzalkonium, (TRAVATAN) 0.004 % ophthalmic solution Place 1 drop into both eyes at bedtime.    . triamcinolone (NASACORT) 55 MCG/ACT AERO nasal inhaler Place 2 sprays into the nose daily.     No current facility-administered medications for this visit.     Allergies:   Patient has no known allergies.   Social History:  The patient  reports that he has never smoked. He has never used smokeless tobacco. He reports that he does not drink alcohol.   Family History:  The patient's family history includes Alzheimer's disease in his mother; Hypertension in his father; Liver cancer in his father.    ROS:  Please see the history of present illness.  Otherwise, review of systems is positive for back pain.   All other systems are reviewed and negative.    PHYSICAL EXAM: VS:  BP (!) 136/92   Pulse (!) 51   Ht 6\' 3"  (1.905 m)   Wt 187 lb (84.8 kg)   SpO2 96%   BMI 23.37 kg/m  , BMI Body mass index is 23.37 kg/m. GEN: Well nourished, well developed, in no acute distress  HEENT: normal  Neck: no JVD, carotid bruits, or masses Cardiac: RRR; no murmurs, rubs, or gallops,no edema  Respiratory:  clear to auscultation bilaterally, normal work of breathing GI: soft, nontender, nondistended, + BS MS: no deformity or atrophy  Skin: warm and dry Neuro:  Strength and  sensation are intact Psych: euthymic mood, full affect  EKG:  EKG is not ordered today. Personal review of the ekg ordered shows sinus rhythm, LVH with repol abnormality  Recent Labs: 03/30/2017: ALT 7; BUN 14; Potassium 3.8; Sodium 141 03/31/2017: Hemoglobin 14.6; Platelets 131 06/09/2017: Creatinine, Ser 1.27    Lipid Panel     Component Value Date/Time   CHOL 174 03/30/2017 0824   TRIG 90.0 03/30/2017 0824   HDL 42.50 03/30/2017 0824   CHOLHDL 4 03/30/2017 0824   VLDL 18.0 03/30/2017 0824   LDLCALC 113 (H) 03/30/2017 0824     Wt Readings from Last 3 Encounters:  06/27/17 187 lb (84.8 kg)  04/11/17 192 lb (87.1 kg)  03/30/17 192 lb (87.1 kg)      Other studies Reviewed: Additional studies/ records that were reviewed today include: TTE 04/11/17  Review of the above records today demonstrates:  - Left ventricle: The cavity size was normal. There was mild   concentric hypertrophy with moderate apical hypertrophy measuring   1.5 cm. Systolic function was normal. The estimated ejection   fraction was in the range of 60% to 65%. Wall motion was normal;   there were no regional wall motion abnormalities. Features are   consistent with a pseudonormal left ventricular filling pattern,   with concomitant abnormal relaxation and increased filling   pressure (grade 2 diastolic dysfunction). Doppler parameters are   consistent with indeterminate ventricular filling pressure. - Aortic valve: Transvalvular velocity was within the normal range.   There was no stenosis. There was mild regurgitation. - Mitral valve: Transvalvular velocity was within the normal range.   There was no evidence for stenosis. There was mild regurgitation. - Left atrium: The atrium was moderately to severely dilated. - Right ventricle: The cavity size was normal. Wall thickness was   normal. Systolic function was normal. - Atrial septum: No defect or patent foramen ovale was identified. - Tricuspid valve:  There was trivial regurgitation.  SPECT 04/11/17  Nuclear stress EF: 47%.  There was no ST segment deviation noted during stress.  This is a low risk study.  The left ventricular ejection fraction is mildly decreased (45-54%).   Normal resting and stress perfusion. No ischemia or infarction EF Estimated 47% with apical dyskinesis Suggest echo correlation For apical HOCM given ECG appearance   Holter 04/19/17 - personally reviewed Baseline rhythm: Sinus Minimum heart rate: 40 BPM.  Average heart rate: 63 BPM.  Maximal heart rate 118 BPM. Symptoms: Patient had multiple episodes of fluttering sensation and this did not correlate with any significant arrhythmias  Conclusion:  Abnormal Holter monitoring.  Patient has baseline abnormal EKG with deep inverted T waves.  Occasional PACs and 715 PVCs in the monitoring with one 4 beat NSVT  CMRI  06/13/17 1. Mildly dilated left ventricle with severely thickened apical segments (up to 19 mm) with a typical spade-shape appearance of the left ventricular cavity. Basal segments have mild hypertrophy. Normal systolic function (LVEF = 64%). There are no regional wall motion abnormalities. There is diffuse late gadolinium enhancement in all apical segments.  2. Normal right ventricular size, thickness and systolic function (LVEF = 53%). There are no regional wall motion abnormalities.  3.  Moderately dilated left atrium.  4.  Mild aortic and mitral regurgitation.  5. Normal pericardium. Trivial pericardial effusion located posteriorly to the heart.  Collectively, these findings are consistent with an apical form of hypertrophic cardiomyopathy with high risk feature - diffuse late gadolinium enhancement in all 4 apical segments.  ASSESSMENT AND PLAN:  1.  Apical hypertrophic cardiomyopathy: Normal EF, though with a 19 mm apex.  Also has diffuse late gadolinium and all apical segments.  Plan to discuss the MRI results with his reading  cardiologist.  Fortunately for him, he does not have any high risk features such as sustained ventricular arrhythmias, family history of sudden death, arrhythmic syncope, nonsustained VT, or LVH greater than 30 mm.  Should he develop these, he would likely need an ICD.  In the interim, we Jolynne Spurgin refer him to genetic counseling.  2.  Hypertension: Blood pressure well controlled today and has been well controlled in the past.  No changes.  3.  Nonsustained VT: 3 and 4 beat runs on his monitor.  He has not had anything longer lasting than that.  He would likely need to have 7 beats or greater to qualify for an ICD.  Current medicines are reviewed at length with the patient today.   The patient does not have concerns regarding his medicines.  The following changes were made today:  none  Labs/ tests ordered today include:  No orders of the defined types were placed in this encounter.  Case discussed with primary cardiology Disposition:   FU with Dane Bloch 3 months  Signed, Diallo Ponder Meredith Leeds, MD  06/27/2017 3:17 PM     [facility] [address] [place] [address] [phone] (office) [phone] (fax)

## 2017-07-03 ENCOUNTER — Other Ambulatory Visit (INDEPENDENT_AMBULATORY_CARE_PROVIDER_SITE_OTHER): Payer: Medicare Other

## 2017-07-03 DIAGNOSIS — D691 Qualitative platelet defects: Secondary | ICD-10-CM | POA: Diagnosis not present

## 2017-07-03 LAB — CBC WITH DIFFERENTIAL/PLATELET
BASOS ABS: 0 10*3/uL (ref 0.0–0.1)
Basophils Relative: 0.5 % (ref 0.0–3.0)
EOS PCT: 1.6 % (ref 0.0–5.0)
Eosinophils Absolute: 0.1 10*3/uL (ref 0.0–0.7)
HCT: 42.6 % (ref 39.0–52.0)
Hemoglobin: 14.2 g/dL (ref 13.0–17.0)
Lymphocytes Relative: 44.5 % (ref 12.0–46.0)
Lymphs Abs: 1.8 10*3/uL (ref 0.7–4.0)
MCHC: 33.3 g/dL (ref 30.0–36.0)
MCV: 94.8 fl (ref 78.0–100.0)
MONOS PCT: 10.2 % (ref 3.0–12.0)
Monocytes Absolute: 0.4 10*3/uL (ref 0.1–1.0)
NEUTROS ABS: 1.7 10*3/uL (ref 1.4–7.7)
Neutrophils Relative %: 43.2 % (ref 43.0–77.0)
PLATELETS: 131 10*3/uL — AB (ref 150.0–400.0)
RBC: 4.49 Mil/uL (ref 4.22–5.81)
RDW: 13.9 % (ref 11.5–15.5)
WBC: 4 10*3/uL (ref 4.0–10.5)

## 2017-07-06 ENCOUNTER — Encounter: Payer: Medicare Other | Admitting: Genetic Counselor

## 2017-07-26 ENCOUNTER — Other Ambulatory Visit: Payer: Self-pay

## 2017-07-26 DIAGNOSIS — I421 Obstructive hypertrophic cardiomyopathy: Secondary | ICD-10-CM

## 2017-08-10 ENCOUNTER — Encounter: Payer: Medicare Other | Admitting: Genetic Counselor

## 2017-08-23 ENCOUNTER — Ambulatory Visit (INDEPENDENT_AMBULATORY_CARE_PROVIDER_SITE_OTHER): Payer: Medicare Other | Admitting: Family

## 2017-08-23 ENCOUNTER — Encounter: Payer: Self-pay | Admitting: Family

## 2017-08-23 ENCOUNTER — Other Ambulatory Visit: Payer: Self-pay | Admitting: Family

## 2017-08-23 VITALS — BP 127/76 | HR 67 | Temp 98.4°F | Resp 16 | Ht 75.0 in | Wt 192.6 lb

## 2017-08-23 DIAGNOSIS — T63441A Toxic effect of venom of bees, accidental (unintentional), initial encounter: Secondary | ICD-10-CM

## 2017-08-23 MED ORDER — MONTELUKAST SODIUM 10 MG PO TABS: 10.0000 mg | ORAL_TABLET | Freq: Every day | ORAL | 2 refills | Status: DC

## 2017-08-23 MED ORDER — EPINEPHRINE 0.3 MG/0.3ML IJ SOAJ: 0.3000 mg | Freq: Once | INTRAMUSCULAR | 0 refills | Status: DC

## 2017-08-23 MED ORDER — METHYLPREDNISOLONE SODIUM SUCC 125 MG IJ SOLR
125.0000 mg | Freq: Once | INTRAMUSCULAR | Status: AC
  Administered 2017-08-23: 125 mg via INTRAMUSCULAR

## 2017-08-23 MED ORDER — PREDNISONE 10 MG PO TABS: ORAL_TABLET | ORAL | 0 refills | Status: DC

## 2017-08-23 NOTE — Progress Notes (Signed)
Subjective:    Patient ID: Justin Frazier, male    DOB: 1960/05/03, 57 y.o.   MRN: [id number]  HPI  Patient is a 57 yr old male who presents today with chief complaint of swelling of hand following bee sting yesterday.  Was moving pine straw to obscure cords leading to his outdoor lighting and exposed a yellow jacket nest. Reports that he was wearing gardening gloves and was stung multiple times through the gloves. Reports that he has been taking benadryl without improvement in the swelling. Left hand is pruritic. He denies tongue/lip swelling or shortness of breath.    Review of Systems    see HPI  Past Medical History:  Diagnosis Date  . Abnormal EKG 03/20/2017  . Allergic rhinitis   . Carpal tunnel syndrome   . Central serous retinopathy   . CKD (chronic kidney disease), stage I   . Glaucoma   . Lumbar strain   . NSVT (nonsustained ventricular tachycardia) ([place])    Seen by Dr. Mina Marble (EP) recommended beta blocker therapy (2014)  . Proteinuria   . Sciatica of left side      Social History   Socioeconomic History  . Marital status: Married    Spouse name: Not on file  . Number of children: Not on file  . Years of education: Not on file  . Highest education level: Not on file  Occupational History  . Not on file  Social Needs  . Financial resource strain: Not on file  . Food insecurity:    Worry: Not on file    Inability: Not on file  . Transportation needs:    Medical: Not on file    Non-medical: Not on file  Tobacco Use  . Smoking status: Never Smoker  . Smokeless tobacco: Never Used  Substance and Sexual Activity  . Alcohol use: No    Frequency: Never  . Drug use: Not on file  . Sexual activity: Not on file  Lifestyle  . Physical activity:    Days per week: Not on file    Minutes per session: Not on file  . Stress: Not on file  Relationships  . Social connections:    Talks on phone: Not on file    Gets together: Not on file    Attends religious  service: Not on file    Active member of club or organization: Not on file    Attends meetings of clubs or organizations: Not on file    Relationship status: Not on file  . Intimate partner violence:    Fear of current or ex partner: Not on file    Emotionally abused: Not on file    Physically abused: Not on file    Forced sexual activity: Not on file  Other Topics Concern  . Not on file  Social History Narrative  . Not on file    Past Surgical History:  Procedure Laterality Date  . COLONOSCOPY  06/11/2013    Family History  Problem Relation Age of Onset  . Alzheimer's disease Mother   . Hypertension Father   . Liver cancer Father     No Known Allergies  Current Outpatient Medications on File Prior to Visit  Medication Sig Dispense Refill  . albuterol (PROVENTIL HFA;VENTOLIN HFA) 108 (90 Base) MCG/ACT inhaler Inhale 2 puffs into the lungs every 6 (six) hours as needed. Every 4-6 hours as needed    . aspirin EC 81 MG tablet Take 162 mg by mouth daily.    Marland Kitchen  Cholecalciferol 5000 units TABS Take 5,000 mg by mouth daily.    . dorzolamide-timolol (COSOPT) 22.3-6.8 MG/ML ophthalmic solution Place 1 drop into both eyes 2 (two) times daily.    Marland Kitchen lisinopril (PRINIVIL,ZESTRIL) 10 MG tablet Take 1 tablet (10 mg total) by mouth daily. 90 tablet 3  . metoprolol succinate (TOPROL-XL) 25 MG 24 hr tablet Take 1 tablet (25 mg total) by mouth daily. 90 tablet 2  . montelukast (SINGULAIR) 10 MG tablet Take 10 mg by mouth at bedtime.    . traMADol (ULTRAM) 50 MG tablet Take 2 tabs every 6 hours as needed for moderate-severe pain. Not to exceed 4 tabs daily. 30 tablet 3  . travoprost, benzalkonium, (TRAVATAN) 0.004 % ophthalmic solution Place 1 drop into both eyes at bedtime.     No current facility-administered medications on file prior to visit.     BP 127/76 (BP Location: Right Arm, Cuff Size: Normal)   Pulse 67   Temp 98.4 F (36.9 C) (Oral)   Resp 16   Ht 6\' 3"  (1.905 m)   Wt 192 lb  9.6 oz (87.4 kg)   SpO2 100%   BMI 24.07 kg/m    Objective:   Physical Exam  Constitutional: He is oriented to person, place, and time. He appears well-developed and well-nourished. No distress.  HENT:  Head: Normocephalic and atraumatic.  Cardiovascular: Normal rate and regular rhythm.  No murmur heard. Pulmonary/Chest: Effort normal and breath sounds normal. No respiratory distress. He has no wheezes. He has no rales.  Musculoskeletal:  + swelling of left hand and wrist  Neurological: He is alert and oriented to person, place, and time.  Skin: Skin is warm and dry.  Psychiatric: He has a normal mood and affect. His behavior is normal. Thought content normal.           Assessment & Plan:  Allergic reaction to bee sting- Rx with IM solumedrol, to be followed by a short burst of oral prednisone.  I did give him an rx for an epipen to keep on hand in case of future worse reaction (tongue/lip/swelling/SOB). We discussed proper administration of epi-pen and need to call 911 if he needs to use.  He verbalizes understanding. He is advised to call if current symptoms worsen or if they are not improved in 3-4 days.

## 2017-08-23 NOTE — Telephone Encounter (Signed)
Initiated PA via covermymeds and received message that medication is covered under pt's plan and for pharmacy to call the helpdesk if problems processing RX. Spoke with pharmacist and gave # [phone] to process RX.

## 2017-08-23 NOTE — Patient Instructions (Signed)
Please begin prednisone tonight. Keep epipen on hand in case of severe allergic reaction in the future (tongue/lip swelling or shortness of breath). Call if new/worsening symptoms or if symptoms are not improved in 2-3 days.

## 2017-09-07 ENCOUNTER — Encounter: Payer: Medicare Other | Admitting: Genetic Counselor

## 2017-09-26 ENCOUNTER — Ambulatory Visit: Payer: Medicare Other | Admitting: Cardiology

## 2017-09-28 ENCOUNTER — Telehealth: Payer: Self-pay | Admitting: Family Medicine

## 2017-09-28 DIAGNOSIS — D696 Thrombocytopenia, unspecified: Secondary | ICD-10-CM

## 2017-09-28 NOTE — Telephone Encounter (Signed)
OK to schedule. TY.

## 2017-09-28 NOTE — Telephone Encounter (Signed)
Copied from [place] [id number]. Topic: Quick Communication - See Telephone Encounter >> Sep 28, 2017  9:17 AM Marja Kays F wrote: Pt is requesting lab work to check is white blood cell count  Best number is [phone]

## 2017-09-29 ENCOUNTER — Other Ambulatory Visit (INDEPENDENT_AMBULATORY_CARE_PROVIDER_SITE_OTHER): Payer: Medicare Other

## 2017-09-29 DIAGNOSIS — D696 Thrombocytopenia, unspecified: Secondary | ICD-10-CM | POA: Diagnosis not present

## 2017-09-29 LAB — CBC WITH DIFFERENTIAL/PLATELET
Basophils Absolute: 0 10*3/uL (ref 0.0–0.1)
Basophils Relative: 0.5 % (ref 0.0–3.0)
EOS ABS: 0.1 10*3/uL (ref 0.0–0.7)
Eosinophils Relative: 1.3 % (ref 0.0–5.0)
HCT: 43.7 % (ref 39.0–52.0)
Hemoglobin: 14.6 g/dL (ref 13.0–17.0)
Lymphocytes Relative: 37.4 % (ref 12.0–46.0)
Lymphs Abs: 1.6 10*3/uL (ref 0.7–4.0)
MCHC: 33.4 g/dL (ref 30.0–36.0)
MCV: 93.5 fl (ref 78.0–100.0)
MONO ABS: 0.5 10*3/uL (ref 0.1–1.0)
Monocytes Relative: 10.7 % (ref 3.0–12.0)
Neutro Abs: 2.2 10*3/uL (ref 1.4–7.7)
Neutrophils Relative %: 50.1 % (ref 43.0–77.0)
Platelets: 134 10*3/uL — ABNORMAL LOW (ref 150.0–400.0)
RBC: 4.67 Mil/uL (ref 4.22–5.81)
RDW: 13.4 % (ref 11.5–15.5)
WBC: 4.4 10*3/uL (ref 4.0–10.5)

## 2017-09-29 NOTE — Telephone Encounter (Signed)
Scheduled appt.

## 2017-11-13 ENCOUNTER — Ambulatory Visit (INDEPENDENT_AMBULATORY_CARE_PROVIDER_SITE_OTHER): Payer: Medicare Other | Admitting: Cardiology

## 2017-11-13 ENCOUNTER — Encounter: Payer: Self-pay | Admitting: Cardiology

## 2017-11-13 VITALS — BP 124/76 | HR 49 | Ht 75.0 in | Wt 189.0 lb

## 2017-11-13 DIAGNOSIS — I493 Ventricular premature depolarization: Secondary | ICD-10-CM | POA: Diagnosis not present

## 2017-11-13 MED ORDER — METOPROLOL SUCCINATE ER 25 MG PO TB24: 25.0000 mg | ORAL_TABLET | Freq: Every day | ORAL | 2 refills | Status: DC

## 2017-11-13 MED ORDER — LISINOPRIL 10 MG PO TABS: 10.0000 mg | ORAL_TABLET | Freq: Every day | ORAL | 2 refills | Status: DC

## 2017-11-13 NOTE — Progress Notes (Signed)
Electrophysiology Office Note   Date:  11/13/2017   ID:  Justin Frazier, DOB 1960-07-22, MRN [id number]  PCP:  [name], DO  Cardiologist:  Frazier Primary Electrophysiologist:  Marrissa Dai Meredith Leeds, MD    No chief complaint on file.    History of Present Illness: Justin Frazier is a 57 y.o. male who is being seen today for the evaluation of hypertrophic cardia myopathy at the request of Justin Frazier. Presenting today for electrophysiology evaluation.  He has a history of CKD stage I, hypertension, LVH, and nonsustained VT on beta-blockers.  He has occasional palpitations on and off for the last several years.  He stopped caffeine and his palpitations went away.  He has not had any episodes of syncope, near syncope.  He does not have a family history of sudden death.  Wore a cardiac monitor that did not show sustained arrhythmias.  He does not have an LV apical aneurysm.  He does have a daughter.  Today, denies symptoms of palpitations, chest pain, shortness of breath, orthopnea, PND, lower extremity edema, claudication, dizziness, presyncope, syncope, bleeding, or neurologic sequela. The patient is tolerating medications without difficulties.  Feeling well not having any issues.  He has no chest pain or shortness of breath.  He is tolerating his medications without issue.   Past Medical History:  Diagnosis Date  . Abnormal EKG 03/20/2017  . Allergic rhinitis   . Carpal tunnel syndrome   . Central serous retinopathy   . CKD (chronic kidney disease), stage I   . Glaucoma   . Lumbar strain   . NSVT (nonsustained ventricular tachycardia) ([place])    Seen by Dr. Mina Marble (EP) recommended beta blocker therapy (2014)  . Proteinuria   . Sciatica of left side    Past Surgical History:  Procedure Laterality Date  . COLONOSCOPY  06/11/2013     Current Outpatient Medications  Medication Sig Dispense Refill  . albuterol (PROVENTIL HFA;VENTOLIN HFA) 108 (90 Base) MCG/ACT  inhaler Inhale 2 puffs into the lungs every 6 (six) hours as needed. Every 4-6 hours as needed    . aspirin EC 81 MG tablet Take 81 mg by mouth daily.     . Cholecalciferol 5000 units TABS Take 5,000 mg by mouth daily.    . dorzolamide-timolol (COSOPT) 22.3-6.8 MG/ML ophthalmic solution Place 1 drop into both eyes 2 (two) times daily.    Marland Kitchen lisinopril (PRINIVIL,ZESTRIL) 10 MG tablet Take 1 tablet (10 mg total) by mouth daily. 90 tablet 2  . metoprolol succinate (TOPROL-XL) 25 MG 24 hr tablet Take 1 tablet (25 mg total) by mouth daily. 90 tablet 2  . montelukast (SINGULAIR) 10 MG tablet Take 1 tablet (10 mg total) by mouth at bedtime. 30 tablet 2  . predniSONE (DELTASONE) 10 MG tablet 4 tabs by mouth once daily for 5 days 20 tablet 0  . traMADol (ULTRAM) 50 MG tablet Take 2 tabs every 6 hours as needed for moderate-severe pain. Not to exceed 4 tabs daily. 30 tablet 3  . travoprost, benzalkonium, (TRAVATAN) 0.004 % ophthalmic solution Place 1 drop into both eyes at bedtime.     No current facility-administered medications for this visit.     Allergies:   Patient has no known allergies.   Social History:  The patient  reports that he has never smoked. He has never used smokeless tobacco. He reports that he does not drink alcohol.   Family History:  The patient's family history includes Alzheimer's disease in  his mother; Hypertension in his father; Liver cancer in his father.   ROS:  Please see the history of present illness.   Otherwise, review of systems is positive for none.   All other systems are reviewed and negative.   PHYSICAL EXAM: VS:  BP 124/76   Pulse (!) 49   Ht 6\' 3"  (1.905 m)   Wt 189 lb (85.7 kg)   BMI 23.62 kg/m  , BMI Body mass index is 23.62 kg/m. GEN: Well nourished, well developed, in no acute distress  HEENT: normal  Neck: no JVD, carotid bruits, or masses Cardiac: RRR; no murmurs, rubs, or gallops,no edema  Respiratory:  clear to auscultation bilaterally, normal  work of breathing GI: soft, nontender, nondistended, + BS MS: no deformity or atrophy  Skin: warm and dry Neuro:  Strength and sensation are intact Psych: euthymic mood, full affect  EKG:  EKG is ordered today. Personal review of the ekg ordered shows sinus rhythm, rate 49, LVH with repolarization abnormalities  Recent Labs: 03/30/2017: ALT 7; BUN 14; Potassium 3.8; Sodium 141 06/09/2017: Creatinine, Ser 1.27 09/29/2017: Hemoglobin 14.6; Platelets 134.0    Lipid Panel     Component Value Date/Time   CHOL 174 03/30/2017 0824   TRIG 90.0 03/30/2017 0824   HDL 42.50 03/30/2017 0824   CHOLHDL 4 03/30/2017 0824   VLDL 18.0 03/30/2017 0824   LDLCALC 113 (H) 03/30/2017 0824     Wt Readings from Last 3 Encounters:  11/13/17 189 lb (85.7 kg)  08/23/17 192 lb 9.6 oz (87.4 kg)  06/27/17 187 lb (84.8 kg)      Other studies Reviewed: Additional studies/ records that were reviewed today include: TTE 04/11/17  Review of the above records today demonstrates:  - Left ventricle: The cavity size was normal. There was mild   concentric hypertrophy with moderate apical hypertrophy measuring   1.5 cm. Systolic function was normal. The estimated ejection   fraction was in the range of 60% to 65%. Wall motion was normal;   there were no regional wall motion abnormalities. Features are   consistent with a pseudonormal left ventricular filling pattern,   with concomitant abnormal relaxation and increased filling   pressure (grade 2 diastolic dysfunction). Doppler parameters are   consistent with indeterminate ventricular filling pressure. - Aortic valve: Transvalvular velocity was within the normal range.   There was no stenosis. There was mild regurgitation. - Mitral valve: Transvalvular velocity was within the normal range.   There was no evidence for stenosis. There was mild regurgitation. - Left atrium: The atrium was moderately to severely dilated. - Right ventricle: The cavity size was  normal. Wall thickness was   normal. Systolic function was normal. - Atrial septum: No defect or patent foramen ovale was identified. - Tricuspid valve: There was trivial regurgitation.  SPECT 04/11/17  Nuclear stress EF: 47%.  There was no ST segment deviation noted during stress.  This is a low risk study.  The left ventricular ejection fraction is mildly decreased (45-54%).   Normal resting and stress perfusion. No ischemia or infarction EF Estimated 47% with apical dyskinesis Suggest echo correlation For apical HOCM given ECG appearance   Holter 04/19/17 - personally reviewed Baseline rhythm: Sinus Minimum heart rate: 40 BPM.  Average heart rate: 63 BPM.  Maximal heart rate 118 BPM. Symptoms: Patient had multiple episodes of fluttering sensation and this did not correlate with any significant arrhythmias  Conclusion:  Abnormal Holter monitoring.  Patient has baseline abnormal EKG with  deep inverted T waves.  Occasional PACs and 715 PVCs in the monitoring with one 4 beat NSVT  CMRI 06/13/17 1. Mildly dilated left ventricle with severely thickened apical segments (up to 19 mm) with a typical spade-shape appearance of the left ventricular cavity. Basal segments have mild hypertrophy. Normal systolic function (LVEF = 64%). There are no regional wall motion abnormalities. There is diffuse late gadolinium enhancement in all apical segments.  2. Normal right ventricular size, thickness and systolic function (LVEF = 53%). There are no regional wall motion abnormalities.  3.  Moderately dilated left atrium.  4.  Mild aortic and mitral regurgitation.  5. Normal pericardium. Trivial pericardial effusion located posteriorly to the heart.  Collectively, these findings are consistent with an apical form of hypertrophic cardiomyopathy with high risk feature - diffuse late gadolinium enhancement in all 4 apical segments.  ASSESSMENT AND PLAN:  1.  Apical hypertrophic  cardiomyopathy: Ejection fraction is normal.  He has apical hypertrophy at 19 mm with diffuse late gadolinium.  I Tayvin Preslar discuss these results with the MRI reader as to his at risk of ventricular arrhythmias.  He has worn a cardiac monitor with minimal ventricular arrhythmias noted.  He has no other high risk features.  We Kalimah Capurro hold off on defibrillator implant at this time.  2.  Hypertension: Controlled.  No changes.  3.  Nonsustained VT: 3 and 4 beat runs noted on cardiac monitor.  Likely lower risk.  Current medicines are reviewed at length with the patient today.   The patient does not have concerns regarding his medicines.  The following changes were made today:  none  Labs/ tests ordered today include:  Orders Placed This Encounter  Procedures  . EKG 12-Lead    Disposition:   FU with Shealee Yordy  months  Signed, Amellia Panik Meredith Leeds, MD  11/13/2017 10:29 AM     [place] [address] [phone] (office) [phone] (fax)

## 2017-11-13 NOTE — Patient Instructions (Addendum)
Medication Instructions:  Your physician recommends that you continue on your current medications as directed. Please refer to the Current Medication list given to you today.  If you need a refill on your cardiac medications before your next appointment, please call your pharmacy.   Labwork: None ordered  Testing/Procedures: None ordered  Follow-Up: Your physician wants you to follow-up in: 6 months with Dr. Camnitz.  You will receive a reminder letter in the mail two months in advance. If you don't receive a letter, please call our office to schedule the follow-up appointment.  Thank you for choosing [facility]!!   Falyn Rubel, RN [phone]         

## 2017-11-14 ENCOUNTER — Other Ambulatory Visit: Payer: Self-pay | Admitting: Family

## 2017-12-05 ENCOUNTER — Ambulatory Visit (INDEPENDENT_AMBULATORY_CARE_PROVIDER_SITE_OTHER): Payer: Medicare Other | Admitting: Medical

## 2017-12-05 ENCOUNTER — Ambulatory Visit (HOSPITAL_BASED_OUTPATIENT_CLINIC_OR_DEPARTMENT_OTHER)
Admission: RE | Admit: 2017-12-05 | Discharge: 2017-12-05 | Disposition: A | Discharge status: Home / Self Care | Payer: Medicare Other | Source: Ambulatory Visit | Attending: Medical | Admitting: Medical

## 2017-12-05 ENCOUNTER — Encounter: Payer: Self-pay | Admitting: Medical

## 2017-12-05 VITALS — BP 133/77 | HR 48 | Temp 98.5°F | Resp 16 | Ht 75.0 in | Wt 187.2 lb

## 2017-12-05 DIAGNOSIS — R0600 Dyspnea, unspecified: Secondary | ICD-10-CM | POA: Diagnosis not present

## 2017-12-05 DIAGNOSIS — R062 Wheezing: Secondary | ICD-10-CM

## 2017-12-05 DIAGNOSIS — K2100 Gastro-esophageal reflux disease with esophagitis, without bleeding: Secondary | ICD-10-CM

## 2017-12-05 DIAGNOSIS — R1013 Epigastric pain: Secondary | ICD-10-CM

## 2017-12-05 DIAGNOSIS — R059 Cough, unspecified: Secondary | ICD-10-CM

## 2017-12-05 DIAGNOSIS — R05 Cough: Secondary | ICD-10-CM | POA: Diagnosis not present

## 2017-12-05 DIAGNOSIS — R0982 Postnasal drip: Secondary | ICD-10-CM

## 2017-12-05 LAB — CBC WITH DIFFERENTIAL/PLATELET
BASOS PCT: 0.8 % (ref 0.0–3.0)
Basophils Absolute: 0 10*3/uL (ref 0.0–0.1)
Eosinophils Absolute: 0.1 10*3/uL (ref 0.0–0.7)
Eosinophils Relative: 1.4 % (ref 0.0–5.0)
HEMATOCRIT: 44.3 % (ref 39.0–52.0)
Hemoglobin: 14.7 g/dL (ref 13.0–17.0)
LYMPHS ABS: 1.9 10*3/uL (ref 0.7–4.0)
LYMPHS PCT: 42.1 % (ref 12.0–46.0)
MCHC: 33.2 g/dL (ref 30.0–36.0)
MCV: 94.5 fl (ref 78.0–100.0)
MONOS PCT: 10.6 % (ref 3.0–12.0)
Monocytes Absolute: 0.5 10*3/uL (ref 0.1–1.0)
NEUTROS ABS: 2 10*3/uL (ref 1.4–7.7)
Neutrophils Relative %: 45.1 % (ref 43.0–77.0)
PLATELETS: 130 10*3/uL — AB (ref 150.0–400.0)
RBC: 4.69 Mil/uL (ref 4.22–5.81)
RDW: 13.9 % (ref 11.5–15.5)
WBC: 4.4 10*3/uL (ref 4.0–10.5)

## 2017-12-05 LAB — COMPREHENSIVE METABOLIC PANEL
ALT: 10 U/L (ref 0–53)
AST: 15 U/L (ref 0–37)
Albumin: 4.2 g/dL (ref 3.5–5.2)
Alkaline Phosphatase: 55 U/L (ref 39–117)
BUN: 14 mg/dL (ref 6–23)
CALCIUM: 9.5 mg/dL (ref 8.4–10.5)
CHLORIDE: 105 meq/L (ref 96–112)
CO2: 29 meq/L (ref 19–32)
Creatinine, Ser: 1.31 mg/dL (ref 0.40–1.50)
GFR: 72.4 mL/min (ref >60.00)
Glucose, Bld: 87 mg/dL (ref 70–99)
Potassium: 4.2 mEq/L (ref 3.5–5.1)
Sodium: 139 mEq/L (ref 135–145)
Total Bilirubin: 0.8 mg/dL (ref 0.2–1.2)
Total Protein: 7.1 g/dL (ref 6.0–8.3)

## 2017-12-05 LAB — LIPASE: Lipase: 30 U/L (ref 11.0–59.0)

## 2017-12-05 LAB — BRAIN NATRIURETIC PEPTIDE: PRO B NATRI PEPTIDE: 79 pg/mL (ref 0.0–100.0)

## 2017-12-05 LAB — AMYLASE: Amylase: 102 U/L (ref 27–131)

## 2017-12-05 LAB — TROPONIN I: TNIDX: 0.03 ug/l (ref 0.00–0.06)

## 2017-12-05 MED ORDER — BECLOMETHASONE DIPROP HFA 40 MCG/ACT IN AERB: 2.0000 | INHALATION_SPRAY | Freq: Two times a day (BID) | RESPIRATORY_TRACT | 0 refills | Status: DC

## 2017-12-05 MED ORDER — LEVOCETIRIZINE DIHYDROCHLORIDE 5 MG PO TABS: 5.0000 mg | ORAL_TABLET | Freq: Every evening | ORAL | 0 refills | Status: DC

## 2017-12-05 MED ORDER — PREDNISONE 10 MG PO TABS: ORAL_TABLET | ORAL | 0 refills | Status: DC

## 2017-12-05 MED ORDER — OMEPRAZOLE 20 MG PO CPDR: 20.0000 mg | DELAYED_RELEASE_CAPSULE | Freq: Every day | ORAL | 3 refills | Status: DC

## 2017-12-05 NOTE — Progress Notes (Signed)
Subjective:    Patient ID: Justin Frazier, male    DOB: April 04, 1960, 57 y.o.   MRN: [id number]  HPI   Pt in for some recent wheezing over past 2 weeks. He has singulair and has inhaler. Pt has been using albuterol about 1-2 times a day. He tries to avoid overusing albuterol. Pt does not smoking. He states stared to wheeze as an adult.  Pt does have some reflux recently. In past used prilosec briefly. Currenlty just using tums. No obvious sneezing or itching eyes. Pt is not diabetic. He does not with walking and active will have wheezing. No swelling of his legs. No recent weight gain.  Pt states the other day when leaning over seat in car felt some transient sharp pain the other day. Points to epigastric and bilateral flank pain. No belching. Not constipated and no diarrhea.  Pt has colonsocopy 3 years ago and told was normal.     Review of Systems  HENT: Positive for postnasal drip. Negative for congestion, ear pain, sinus pressure and sinus pain.   Eyes: Negative for discharge, redness and visual disturbance.  Respiratory: Positive for cough and wheezing.        Some productive cough.  Cardiovascular: Negative for chest pain and palpitations.  Gastrointestinal: Positive for abdominal pain.       See hpi.  Musculoskeletal: Negative for joint swelling and neck pain.  Skin: Negative for rash.  Neurological: Negative for dizziness.  Hematological: Negative for adenopathy. Does not bruise/bleed easily.  Psychiatric/Behavioral: Negative for behavioral problems and confusion.   Past Medical History:  Diagnosis Date  . Abnormal EKG 03/20/2017  . Allergic rhinitis   . Carpal tunnel syndrome   . Central serous retinopathy   . CKD (chronic kidney disease), stage I   . Glaucoma   . Lumbar strain   . NSVT (nonsustained ventricular tachycardia) ([place])    Seen by Dr. Mina Marble (EP) recommended beta blocker therapy (2014)  . Proteinuria   . Sciatica of left side      Social History    Socioeconomic History  . Marital status: Married    Spouse name: Not on file  . Number of children: Not on file  . Years of education: Not on file  . Highest education level: Not on file  Occupational History  . Not on file  Social Needs  . Financial resource strain: Not on file  . Food insecurity:    Worry: Not on file    Inability: Not on file  . Transportation needs:    Medical: Not on file    Non-medical: Not on file  Tobacco Use  . Smoking status: Never Smoker  . Smokeless tobacco: Never Used  Substance and Sexual Activity  . Alcohol use: No    Frequency: Never  . Drug use: Not on file  . Sexual activity: Not on file  Lifestyle  . Physical activity:    Days per week: Not on file    Minutes per session: Not on file  . Stress: Not on file  Relationships  . Social connections:    Talks on phone: Not on file    Gets together: Not on file    Attends religious service: Not on file    Active member of club or organization: Not on file    Attends meetings of clubs or organizations: Not on file    Relationship status: Not on file  . Intimate partner violence:    Fear of current or  ex partner: Not on file    Emotionally abused: Not on file    Physically abused: Not on file    Forced sexual activity: Not on file  Other Topics Concern  . Not on file  Social History Narrative  . Not on file    Past Surgical History:  Procedure Laterality Date  . COLONOSCOPY  06/11/2013    Family History  Problem Relation Age of Onset  . Alzheimer's disease Mother   . Hypertension Father   . Liver cancer Father     No Known Allergies  Current Outpatient Medications on File Prior to Visit  Medication Sig Dispense Refill  . albuterol (PROVENTIL HFA;VENTOLIN HFA) 108 (90 Base) MCG/ACT inhaler Inhale 2 puffs into the lungs every 6 (six) hours as needed. Every 4-6 hours as needed    . aspirin EC 81 MG tablet Take 81 mg by mouth daily.     . Cholecalciferol 5000 units TABS Take  5,000 mg by mouth daily.    . dorzolamide-timolol (COSOPT) 22.3-6.8 MG/ML ophthalmic solution Place 1 drop into both eyes 2 (two) times daily.    Marland Kitchen lisinopril (PRINIVIL,ZESTRIL) 10 MG tablet Take 1 tablet (10 mg total) by mouth daily. 90 tablet 2  . metoprolol succinate (TOPROL-XL) 25 MG 24 hr tablet Take 1 tablet (25 mg total) by mouth daily. 90 tablet 2  . montelukast (SINGULAIR) 10 MG tablet Take 1 tablet (10 mg total) by mouth at bedtime. 30 tablet 2  . predniSONE (DELTASONE) 10 MG tablet 4 tabs by mouth once daily for 5 days 20 tablet 0  . traMADol (ULTRAM) 50 MG tablet Take 2 tabs every 6 hours as needed for moderate-severe pain. Not to exceed 4 tabs daily. 30 tablet 3  . travoprost, benzalkonium, (TRAVATAN) 0.004 % ophthalmic solution Place 1 drop into both eyes at bedtime.     No current facility-administered medications on file prior to visit.     BP 133/77   Pulse (!) 48   Temp 98.5 F (36.9 C) (Oral)   Resp 16   Ht 6\' 3"  (1.905 m)   Wt 187 lb 3.2 oz (84.9 kg)   SpO2 100%   BMI 23.40 kg/m       Objective:   Physical Exam  General  Mental Status - Alert. General Appearance - Well groomed. Not in acute distress.  Skin Rashes- No Rashes.  HEENT Head- Normal. Ear Auditory Canal - Left- Normal. Right - Normal.Tympanic Membrane- Left- Normal. Right- Normal. Eye Sclera/Conjunctiva- Left- Normal. Right- Normal. Nose & Sinuses Nasal Mucosa- Left-  Boggy and Congested. Right-  Boggy and  Congested.Bilateral no  maxillary and no frontal sinus pressure. Mouth & Throat Lips: Upper Lip- Normal: no dryness, cracking, pallor, cyanosis, or vesicular eruption. Lower Lip-Normal: no dryness, cracking, pallor, cyanosis or vesicular eruption. Buccal Mucosa- Bilateral- No Aphthous ulcers. Oropharynx- No Discharge or Erythema. +pnd. Tonsils: Characteristics- Bilateral- No Erythema or Congestion. Size/Enlargement- Bilateral- No enlargement. Discharge- bilateral-None.  Neck Neck-  Supple. No Masses.   Chest and Lung Exam Auscultation: Breath Sounds:-Clear even and unlabored.  Cardiovascular Auscultation:Rythm- Regular, rate and rhythm. Murmurs & Other Heart Sounds:Ausculatation of the heart reveal- No Murmurs.  Lymphatic Head & Neck General Head & Neck Lymphatics: Bilateral: Description- No Localized lymphadenopathy.  Lower ext- no pedal edema.negative homans signs.  Abdomen- faint epigastric tenderness. +bs, no reoubound or guarding. No hernia on exam. No masses.  Back no cva tenderness      Assessment & Plan:  Your wheezing has  worsened over the last 2 weeks, I am prescribing you Qvar steroid inhaler to take daily and still use albuterol if needed.  I do think it is a good idea to keep your reflux/GERD symptoms under control and prescribed omeprazole to take daily.  In addition you might have some allergies recently as you have some obvious postnasal drainage on exam.  Continue Singulair and prescribe Xyzal antihistamine.  Your cough is minimally productive recently.  If it worsens please let me know and I would give you antibiotic.  We will get chest x-ray today, BNP and troponin.  After review of your cardiac work-up recently and echocardiogram results, I do think these test will be beneficial.  We will update you on those results when they are are in.  Also for history of GERD and abdomen discomfort, will get CBC, CMP, amylase lipase and H. pylori breath test.  Follow-up in 7 to 10 days or as needed.    Mackie Pai, PA-C

## 2017-12-05 NOTE — Patient Instructions (Addendum)
Your wheezing has worsened over the last 2 weeks, I am prescribing you Qvar steroid inhaler to take daily and still use albuterol if needed.  I do think it is a good idea to keep your reflux/GERD symptoms under control and prescribed omeprazole to take daily.  In addition you might have some allergies recently as you have some obvious postnasal drainage on exam.  Continue Singulair and prescribe Xyzal antihistamine.  Your cough is minimally productive recently.  If it worsens please let me know and I would give you antibiotic.  We will get chest x-ray today, BNP and troponin.  After review of your cardiac work-up recently and echocardiogram results, I do think these test will be beneficial.  We will update you on those results when they are are in.  Also for history of GERD and abdomen discomfort, will get CBC, CMP, amylase lipase and H. pylori breath test.  Making prednisone available to use if needed/wheezing worsens despite the above measures.  Follow-up in 7 to 10 days or as needed.

## 2017-12-06 ENCOUNTER — Other Ambulatory Visit: Payer: Self-pay | Admitting: Medical

## 2017-12-06 LAB — H. PYLORI BREATH TEST: H. pylori Breath Test: NOT DETECTED

## 2017-12-07 ENCOUNTER — Telehealth: Payer: Self-pay | Admitting: Family Medicine

## 2017-12-07 DIAGNOSIS — D696 Thrombocytopenia, unspecified: Secondary | ICD-10-CM

## 2017-12-07 NOTE — Telephone Encounter (Signed)
It's not really something you can increase by changing lifestyle. At that level, he is not at a significant risk of bleeding. If he would like to see a hematologist to explore other options, I am happy to set that up. He is at a level where I am comfortable monitoring to make sure it doesn't drop lower though. TY.

## 2017-12-07 NOTE — Telephone Encounter (Signed)
Telephone call from patient inquiring about lab results.  Provided lab results of Dr.  Nani Ravens on 12/05/17 Patient voices understanding Also provided Platelet count which was 130.  Patient inquires how does he increase his platelet count. Informed patient that I would relate question to PCP  and he would respond. Patient voices understanding.

## 2017-12-08 NOTE — Telephone Encounter (Signed)
Called the patient informed of PCP instructions. He would like a referral to just get hematologist recommendations

## 2017-12-08 NOTE — Telephone Encounter (Signed)
OK to refer to hematology, dx thrombocytopenia. Ty.

## 2017-12-08 NOTE — Telephone Encounter (Signed)
Referral done

## 2017-12-08 NOTE — Addendum Note (Signed)
Addended by: Sharon Seller B on: 12/08/2017 10:05 AM   Modules accepted: Orders

## 2017-12-13 ENCOUNTER — Telehealth: Payer: Self-pay | Admitting: Family

## 2017-12-13 NOTE — Telephone Encounter (Signed)
lmom to inform pt of New Patient appt 11/26 at 2 pm. Appt letter mailed 10/30

## 2017-12-14 ENCOUNTER — Telehealth: Payer: Self-pay

## 2017-12-14 NOTE — Telephone Encounter (Signed)
Copied from [place] [id number]. Topic: General - Other >> Dec 14, 2017 12:54 PM Mcneil, Ja-Kwan wrote: Reason for CRM: Pt states he went to [facility] to pick up the Rx for ARNUITY ELLIPTA 50 MCG/ACT AEPB and he was told that the Rx could not be filled. Pt states he was told that his doctor needed to contact the pharmacy before the Rx could be filled. Cb# [phone]

## 2017-12-15 NOTE — Telephone Encounter (Signed)
Called the patient left message to call back 

## 2017-12-21 ENCOUNTER — Telehealth: Payer: Self-pay | Admitting: Family Medicine

## 2017-12-21 MED ORDER — FLUTICASONE FUROATE 50 MCG/ACT IN AEPB: 1.0000 | INHALATION_SPRAY | Freq: Two times a day (BID) | RESPIRATORY_TRACT | 0 refills | Status: DC

## 2017-12-21 NOTE — Telephone Encounter (Signed)
1 puff twice daily. TY.

## 2017-12-21 NOTE — Telephone Encounter (Signed)
r 

## 2017-12-21 NOTE — Telephone Encounter (Signed)
Copied from [place] [id number]. Topic: General - Other >> Dec 21, 2017 10:10 AM Janace Aris A wrote: Reason for CRM: Pt called in, he says his pharmacy is requesting specific directions on how he is suppose to use the medication ARNUITY ELLIPTA 50 MCG, it has no instructions on the Rx.   Please advise

## 2017-12-21 NOTE — Telephone Encounter (Signed)
See telephone note dated 12/21/2017 for completion of this message

## 2017-12-21 NOTE — Telephone Encounter (Signed)
Prescription sent in with instructions.

## 2017-12-26 ENCOUNTER — Other Ambulatory Visit: Payer: Self-pay | Admitting: Family

## 2017-12-26 ENCOUNTER — Other Ambulatory Visit: Payer: Self-pay | Admitting: Medical

## 2018-01-09 ENCOUNTER — Other Ambulatory Visit: Payer: Medicare Other

## 2018-01-09 ENCOUNTER — Other Ambulatory Visit: Payer: Self-pay | Admitting: Family

## 2018-01-09 ENCOUNTER — Ambulatory Visit: Payer: Medicare Other | Admitting: Family

## 2018-01-09 DIAGNOSIS — D696 Thrombocytopenia, unspecified: Secondary | ICD-10-CM

## 2018-01-25 ENCOUNTER — Other Ambulatory Visit: Payer: Self-pay | Admitting: Family Medicine

## 2018-01-26 ENCOUNTER — Other Ambulatory Visit: Payer: Self-pay | Admitting: Hematology

## 2018-01-26 DIAGNOSIS — D696 Thrombocytopenia, unspecified: Secondary | ICD-10-CM

## 2018-01-26 NOTE — Progress Notes (Signed)
[facility] CONSULT NOTE  Patient Care Team: [name], DO as PCP - General (Family Medicine) Constance Haw, MD as PCP - Electrophysiology (Cardiology)  HEME/ONC OVERVIEW: 1. Mild thrombocytopenia -Plts ~139k dating back to 2017; nl WBC and Hgb   ASSESSMENT & PLAN  Mild thrombocytopenia -I reviewed the patient's records in detail, including PCP clinic notes and lab studies dating back to 2017 -In summary, patient has had a chronic, mild thrombocytopenia dating back to 2017, with plts ~130k -Clinically, patient denies any symptoms of abnormal bleeding or bruising -I reviewed the peripheral blood smear, which showed normal platelet size and morphology; there were occasional immature platelets, likely benign  -Plts 142k today -Nutritional and viral studies, including HIV, hep B/C, B12, and folate, are pending  -Given the borderline elevated prothrombin time, I have ordered abdominal US to assess for any liver disease  -If the above work-up is unremarkable and platelet count remains stable, I would not recommend any additional work-up at this time, unless he develops clinically significant symptoms, such as excessive bleeding or bruising, or steady decline in platelet count -Patient was counseled on any concerning symptoms, such as hematemesis, hemoptysis, hematochezia, melena, hematuria, in which case he should seek further care promptly -No indication for bone marrow biopsy at this time  Orders Placed This Encounter  Procedures  . US Abdomen Complete    Standing Status:   Future    Standing Expiration Date:   02/01/2019    Order Specific Question:   Reason for Exam (SYMPTOM  OR DIAGNOSIS REQUIRED)    Answer:   Thrombocytopenia    Order Specific Question:   Preferred imaging location?    Answer:   [facility]  . CBC with Differential (Cancer Center Only)    Standing Status:   Future    Standing Expiration Date:   03/08/2019   A total of  more than 45 minutes were spent face-to-face with the patient during this encounter and over half of that time was spent on counseling and coordination of care as outlined above.  All questions were answered. The patient knows to call the clinic with any problems, questions or concerns. No barriers to learning was detected.  Return in 1 month for labs and clinic follow-up.   Tish Men, MD 02/01/2018 1:11 PM   CHIEF COMPLAINTS/PURPOSE OF CONSULTATION:  "I am here for low platelet count"  HISTORY OF PRESENTING ILLNESS:  Justin Frazier 57 y.o. male is here because of mild thrombocytopenia.  He was found to have abnormal CBC from dating back to 2017 (plts ~130k). He denies excessive bruising/bleeding, such as spontaneous epistaxis, hematuria, melena or hematochezia The patient denies history of liver disease, exposure to heparin, history of cardiac murmur/prior cardiovascular surgery or recent new medications He denies any recent medication change, or EtOH use.  He denies any recent infections or constitutional symptoms. He denies prior blood or platelet transfusions  MEDICAL HISTORY:  Past Medical History:  Diagnosis Date  . Abnormal EKG 03/20/2017  . Allergic rhinitis   . Carpal tunnel syndrome   . Central serous retinopathy   . CKD (chronic kidney disease), stage I   . Glaucoma   . Lumbar strain   . NSVT (nonsustained ventricular tachycardia) ([place])    Seen by Dr. Mina Marble (EP) recommended beta blocker therapy (2014)  . Proteinuria   . Sciatica of left side     SURGICAL HISTORY: Past Surgical History:  Procedure Laterality Date  . COLONOSCOPY  06/11/2013    SOCIAL HISTORY: Social History   Socioeconomic History  . Marital status: Married    Spouse name: Not on file  . Number of children: Not on file  . Years of education: Not on file  . Highest education level: Not on file  Occupational History  . Not on file  Social Needs  . Financial resource strain: Not on file   . Food insecurity:    Worry: Not on file    Inability: Not on file  . Transportation needs:    Medical: Not on file    Non-medical: Not on file  Tobacco Use  . Smoking status: Never Smoker  . Smokeless tobacco: Never Used  Substance and Sexual Activity  . Alcohol use: No    Frequency: Never  . Drug use: Not on file  . Sexual activity: Not on file  Lifestyle  . Physical activity:    Days per week: Not on file    Minutes per session: Not on file  . Stress: Not on file  Relationships  . Social connections:    Talks on phone: Not on file    Gets together: Not on file    Attends religious service: Not on file    Active member of club or organization: Not on file    Attends meetings of clubs or organizations: Not on file    Relationship status: Not on file  . Intimate partner violence:    Fear of current or ex partner: Not on file    Emotionally abused: Not on file    Physically abused: Not on file    Forced sexual activity: Not on file  Other Topics Concern  . Not on file  Social History Narrative  . Not on file    FAMILY HISTORY: Family History  Problem Relation Age of Onset  . Alzheimer's disease Mother   . Hypertension Father   . Liver cancer Father     ALLERGIES:  has No Known Allergies.  MEDICATIONS:  Current Outpatient Medications  Medication Sig Dispense Refill  . albuterol (PROVENTIL HFA;VENTOLIN HFA) 108 (90 Base) MCG/ACT inhaler Inhale 2 puffs into the lungs every 6 (six) hours as needed. Every 4-6 hours as needed    . aspirin EC 81 MG tablet Take 81 mg by mouth daily.     . Cholecalciferol 5000 units TABS Take 5,000 mg by mouth daily.    . dorzolamide-timolol (COSOPT) 22.3-6.8 MG/ML ophthalmic solution Place 1 drop into both eyes 2 (two) times daily.    . Fluticasone Furoate (ARNUITY ELLIPTA) 50 MCG/ACT AEPB Inhale 1 puff into the lungs 2 (two) times daily. 1 each 0  . levocetirizine (XYZAL) 5 MG tablet TAKE 1 TABLET BY MOUTH EVERY DAY IN THE EVENING 30  tablet 0  . lisinopril (PRINIVIL,ZESTRIL) 10 MG tablet Take 1 tablet (10 mg total) by mouth daily. 90 tablet 2  . metoprolol succinate (TOPROL-XL) 25 MG 24 hr tablet Take 1 tablet (25 mg total) by mouth daily. 90 tablet 2  . montelukast (SINGULAIR) 10 MG tablet TAKE 1 TABLET BY MOUTH EVERYDAY AT BEDTIME 90 tablet 0  . omeprazole (PRILOSEC) 20 MG capsule Take 1 capsule (20 mg total) by mouth daily. 30 capsule 3  . predniSONE (DELTASONE) 10 MG tablet 4 tabs by mouth once daily for 5 days 20 tablet 0  . predniSONE (DELTASONE) 10 MG tablet 5 TAB PO DAY 1 4 TAB PO DAY 2 3 TAB PO DAY 3 2 TAB PO DAY 4  1 TAB PO DAY 5 15 tablet 0  . traMADol (ULTRAM) 50 MG tablet Take 2 tabs every 6 hours as needed for moderate-severe pain. Not to exceed 4 tabs daily. 30 tablet 3  . travoprost, benzalkonium, (TRAVATAN) 0.004 % ophthalmic solution Place 1 drop into both eyes at bedtime.     No current facility-administered medications for this visit.     REVIEW OF SYSTEMS:   Constitutional: ( - ) fevers, ( - )  chills , ( - ) night sweats Eyes: ( - ) blurriness of vision, ( - ) double vision, ( - ) watery eyes Ears, nose, mouth, throat, and face: ( - ) mucositis, ( - ) sore throat Respiratory: ( - ) cough, ( - ) dyspnea, ( - ) wheezes Cardiovascular: ( - ) palpitation, ( - ) chest discomfort, ( - ) lower extremity swelling Gastrointestinal:  ( - ) nausea, ( - ) heartburn, ( - ) change in bowel habits Skin: ( - ) abnormal skin rashes Lymphatics: ( - ) new lymphadenopathy, ( - ) easy bruising Neurological: ( - ) numbness, ( - ) tingling, ( - ) new weaknesses Behavioral/Psych: ( - ) mood change, ( - ) new changes  All other systems were reviewed with the patient and are negative.  PHYSICAL EXAMINATION: ECOG PERFORMANCE STATUS: 0 - Asymptomatic  Vitals:   02/01/18 1238  BP: 140/78  Pulse: 77  Resp: 18  Temp: 98.2 F (36.8 C)  SpO2: 100%   Filed Weights   02/01/18 1238  Weight: 190 lb (86.2 kg)     GENERAL: alert, no distress and comfortable SKIN: skin color, texture, turgor are normal, no rashes or significant lesions EYES: conjunctiva are pink and non-injected, sclera clear OROPHARYNX: no exudate, no erythema; lips, buccal mucosa, and tongue normal  NECK: supple, non-tender LYMPH:  no palpable lymphadenopathy in the cervical or axillary  LUNGS: clear to auscultation and percussion with normal breathing effort HEART: regular rate & rhythm and no murmurs and no lower extremity edema ABDOMEN: soft, non-tender, non-distended, normal bowel sounds Musculoskeletal: no cyanosis of digits and no clubbing  PSYCH: alert & oriented x 3, fluent speech NEURO: no focal motor/sensory deficits  LABORATORY DATA:  I have reviewed the data as listed Recent Results (from the past 2160 hour(s))  CBC w/Diff     Status: Abnormal   Collection Time: 12/05/17  2:37 PM  Result Value Ref Range   WBC 4.4 4.0 - 10.5 K/uL   RBC 4.69 4.22 - 5.81 Mil/uL   Hemoglobin 14.7 13.0 - 17.0 g/dL   HCT 44.3 39.0 - 52.0 %   MCV 94.5 78.0 - 100.0 fl   MCHC 33.2 30.0 - 36.0 g/dL   RDW 13.9 11.5 - 15.5 %   Platelets 130.0 (L) 150.0 - 400.0 K/uL   Neutrophils Relative % 45.1 43.0 - 77.0 %   Lymphocytes Relative 42.1 12.0 - 46.0 %   Monocytes Relative 10.6 3.0 - 12.0 %   Eosinophils Relative 1.4 0.0 - 5.0 %   Basophils Relative 0.8 0.0 - 3.0 %   Neutro Abs 2.0 1.4 - 7.7 K/uL   Lymphs Abs 1.9 0.7 - 4.0 K/uL   Monocytes Absolute 0.5 0.1 - 1.0 K/uL   Eosinophils Absolute 0.1 0.0 - 0.7 K/uL   Basophils Absolute 0.0 0.0 - 0.1 K/uL  Comp Met (CMET)     Status: None   Collection Time: 12/05/17  2:37 PM  Result Value Ref Range   Sodium 139 135 -  145 mEq/L   Potassium 4.2 3.5 - 5.1 mEq/L   Chloride 105 96 - 112 mEq/L   CO2 29 19 - 32 mEq/L   Glucose, Bld 87 70 - 99 mg/dL   BUN 14 6 - 23 mg/dL   Creatinine, Ser 1.31 0.40 - 1.50 mg/dL   Total Bilirubin 0.8 0.2 - 1.2 mg/dL   Alkaline Phosphatase 55 39 - 117 U/L    AST 15 0 - 37 U/L   ALT 10 0 - 53 U/L   Total Protein 7.1 6.0 - 8.3 g/dL   Albumin 4.2 3.5 - 5.2 g/dL   Calcium 9.5 8.4 - 10.5 mg/dL   GFR 72.40 >60.00 mL/min  Amylase     Status: None   Collection Time: 12/05/17  2:37 PM  Result Value Ref Range   Amylase 102 27 - 131 U/L  Lipase     Status: None   Collection Time: 12/05/17  2:37 PM  Result Value Ref Range   Lipase 30.0 11.0 - 59.0 U/L  H. pylori breath test     Status: None   Collection Time: 12/05/17  2:37 PM  Result Value Ref Range   H. pylori Breath Test NOT DETECTED NOT DETECT    Comment: . Antimicrobials, proton pump inhibitors, and bismuth preparations are known to suppress H. pylori, and  ingestion of these prior to H. pylori diagnostic testing may lead to false negative results. If clinically  indicated, the test may be repeated on a new specimen obtained two weeks after discontinuing treatment. However, a positive result is still clinically valid.   Troponin I     Status: None   Collection Time: 12/05/17  2:37 PM  Result Value Ref Range   TNIDX 0.03 0.00 - 0.06 ug/l  B Nat Peptide     Status: None   Collection Time: 12/05/17  2:37 PM  Result Value Ref Range   Pro B Natriuretic peptide (BNP) 79.0 0.0 - 100.0 pg/mL  CBC with Differential (Cancer Center Only)     Status: Abnormal   Collection Time: 02/01/18 12:03 PM  Result Value Ref Range   WBC Count 4.3 4.0 - 10.5 K/uL   RBC 4.75 4.22 - 5.81 MIL/uL   Hemoglobin 14.4 13.0 - 17.0 g/dL   HCT 45.7 39.0 - 52.0 %   MCV 96.2 80.0 - 100.0 fL   MCH 30.3 26.0 - 34.0 pg   MCHC 31.5 30.0 - 36.0 g/dL   RDW 12.9 11.5 - 15.5 %   Platelet Count 142 (L) 150 - 400 K/uL   nRBC 0.0 0.0 - 0.2 %   Neutrophils Relative % 48 %   Neutro Abs 2.1 1.7 - 7.7 K/uL   Lymphocytes Relative 39 %   Lymphs Abs 1.7 0.7 - 4.0 K/uL   Monocytes Relative 10 %   Monocytes Absolute 0.4 0.1 - 1.0 K/uL   Eosinophils Relative 2 %   Eosinophils Absolute 0.1 0.0 - 0.5 K/uL   Basophils Relative 1 %    Basophils Absolute 0.0 0.0 - 0.1 K/uL   Immature Granulocytes 0 %   Abs Immature Granulocytes 0.00 0.00 - 0.07 K/uL    Comment: Performed at [facility] at [facility], [address], [place], [place] [address]  CMP ([facility] only)     Status: Abnormal   Collection Time: 02/01/18 12:03 PM  Result Value Ref Range   Sodium 140 135 - 145 mmol/L   Potassium 3.6 3.5 - 5.1  mmol/L   Chloride 105 98 - 111 mmol/L   CO2 28 22 - 32 mmol/L   Glucose, Bld 104 (H) 70 - 99 mg/dL   BUN 18 6 - 20 mg/dL   Creatinine 1.22 0.61 - 1.24 mg/dL   Calcium 9.4 8.9 - 10.3 mg/dL   Total Protein 6.8 6.5 - 8.1 g/dL   Albumin 4.4 3.5 - 5.0 g/dL   AST 18 15 - 41 U/L   ALT 9 0 - 44 U/L   Alkaline Phosphatase 64 38 - 126 U/L   Total Bilirubin 1.2 0.3 - 1.2 mg/dL   GFR, Est Non Af Am >60 >60 mL/min   GFR, Est AFR Am >60 >60 mL/min   Anion gap 7 5 - 15    Comment: Performed at [facility] Lab at [facility], [address], [place], [place] [address]  [facility])     Status: None   Collection Time: 02/01/18 12:03 PM  Result Value Ref Range   Smear Review SMEAR STAINED AND AVAILABLE FOR REVIEW     Comment: Performed at [facility] Lab at [facility], [address], [place], [place] [address]  Protime-INR     Status: Abnormal   Collection Time: 02/01/18 12:03 PM  Result Value Ref Range   Prothrombin Time 16.0 (H) 11.4 - 15.2 seconds   INR 1.30     Comment: Performed at [facility], [address] [place], [place] [address]  APTT     Status: None   Collection Time: 02/01/18 12:03 PM  Result Value Ref Range   aPTT 29 24 - 36 seconds    Comment: Performed at [facility], [address] [place], [place] [address]   I personally reviewed the patient's peripheral blood smear today.  The red blood cells were of normal morphology.  There was no schistocytosis.  The white blood cells were of normal  morphology. There were no peripheral circulating blasts. The platelets were of normal size and morphology. There were occasional immature platelets, likely benign. I verified that there were no platelet clumping.

## 2018-02-01 ENCOUNTER — Inpatient Hospital Stay: Payer: Medicare Other | Attending: Hematology

## 2018-02-01 ENCOUNTER — Inpatient Hospital Stay (HOSPITAL_BASED_OUTPATIENT_CLINIC_OR_DEPARTMENT_OTHER): Payer: Medicare Other | Admitting: Hematology

## 2018-02-01 ENCOUNTER — Encounter: Payer: Self-pay | Admitting: Hematology

## 2018-02-01 VITALS — BP 140/78 | HR 77 | Temp 98.2°F | Resp 18 | Ht 75.0 in | Wt 190.0 lb

## 2018-02-01 DIAGNOSIS — Z800 Family history of malignant neoplasm of digestive organs: Secondary | ICD-10-CM

## 2018-02-01 DIAGNOSIS — D696 Thrombocytopenia, unspecified: Secondary | ICD-10-CM

## 2018-02-01 DIAGNOSIS — R809 Proteinuria, unspecified: Secondary | ICD-10-CM | POA: Diagnosis not present

## 2018-02-01 DIAGNOSIS — N181 Chronic kidney disease, stage 1: Secondary | ICD-10-CM

## 2018-02-01 DIAGNOSIS — R9431 Abnormal electrocardiogram [ECG] [EKG]: Secondary | ICD-10-CM

## 2018-02-01 DIAGNOSIS — Z79899 Other long term (current) drug therapy: Secondary | ICD-10-CM | POA: Diagnosis not present

## 2018-02-01 DIAGNOSIS — Z7982 Long term (current) use of aspirin: Secondary | ICD-10-CM | POA: Insufficient documentation

## 2018-02-01 HISTORY — DX: Thrombocytopenia, unspecified: D69.6

## 2018-02-01 LAB — SAVE SMEAR(SSMR), FOR PROVIDER SLIDE REVIEW

## 2018-02-01 LAB — T4, FREE: Free T4: 0.81 ng/dL — ABNORMAL LOW (ref 0.82–1.77)

## 2018-02-01 LAB — CMP (CANCER CENTER ONLY)
ALT: 9 U/L (ref 0–44)
AST: 18 U/L (ref 15–41)
Albumin: 4.4 g/dL (ref 3.5–5.0)
Alkaline Phosphatase: 64 U/L (ref 38–126)
Anion gap: 7 (ref 5–15)
BUN: 18 mg/dL (ref 6–20)
CO2: 28 mmol/L (ref 22–32)
CREATININE: 1.22 mg/dL (ref 0.61–1.24)
Calcium: 9.4 mg/dL (ref 8.9–10.3)
Chloride: 105 mmol/L (ref 98–111)
GFR, Est AFR Am: >60 mL/min (ref >60)
GFR, Estimated: >60 mL/min (ref >60)
Glucose, Bld: 104 mg/dL — ABNORMAL HIGH (ref 70–99)
Potassium: 3.6 mmol/L (ref 3.5–5.1)
Sodium: 140 mmol/L (ref 135–145)
Total Bilirubin: 1.2 mg/dL (ref 0.3–1.2)
Total Protein: 6.8 g/dL (ref 6.5–8.1)

## 2018-02-01 LAB — CBC WITH DIFFERENTIAL (CANCER CENTER ONLY)
Abs Immature Granulocytes: 0 10*3/uL (ref 0.00–0.07)
Basophils Absolute: 0 10*3/uL (ref 0.0–0.1)
Basophils Relative: 1 %
Eosinophils Absolute: 0.1 10*3/uL (ref 0.0–0.5)
Eosinophils Relative: 2 %
HCT: 45.7 % (ref 39.0–52.0)
Hemoglobin: 14.4 g/dL (ref 13.0–17.0)
Immature Granulocytes: 0 %
Lymphocytes Relative: 39 %
Lymphs Abs: 1.7 10*3/uL (ref 0.7–4.0)
MCH: 30.3 pg (ref 26.0–34.0)
MCHC: 31.5 g/dL (ref 30.0–36.0)
MCV: 96.2 fL (ref 80.0–100.0)
Monocytes Absolute: 0.4 10*3/uL (ref 0.1–1.0)
Monocytes Relative: 10 %
NEUTROS ABS: 2.1 10*3/uL (ref 1.7–7.7)
Neutrophils Relative %: 48 %
Platelet Count: 142 10*3/uL — ABNORMAL LOW (ref 150–400)
RBC: 4.75 MIL/uL (ref 4.22–5.81)
RDW: 12.9 % (ref 11.5–15.5)
WBC Count: 4.3 10*3/uL (ref 4.0–10.5)
nRBC: 0 % (ref 0.0–0.2)

## 2018-02-01 LAB — VITAMIN B12: VITAMIN B 12: 300 pg/mL (ref 180–914)

## 2018-02-01 LAB — PROTIME-INR
INR: 1.3
Prothrombin Time: 16 seconds — ABNORMAL HIGH (ref 11.4–15.2)

## 2018-02-01 LAB — FOLATE: Folate: 10.5 ng/mL (ref >5.9)

## 2018-02-01 LAB — APTT: aPTT: 29 seconds (ref 24–36)

## 2018-02-02 LAB — HIV ANTIBODY (ROUTINE TESTING W REFLEX): HIV Screen 4th Generation wRfx: NONREACTIVE

## 2018-02-02 LAB — HEPATITIS C ANTIBODY (REFLEX): HCV Ab: <0.1 s/co ratio (ref 0.0–0.9)

## 2018-02-02 LAB — HCV COMMENT:

## 2018-02-02 LAB — LACTATE DEHYDROGENASE: LDH: 187 U/L (ref 98–192)

## 2018-02-02 LAB — HEPATITIS B SURFACE ANTIBODY,QUALITATIVE: HEP B S AB: NONREACTIVE

## 2018-02-02 LAB — TSH: TSH: 1.1 u[IU]/mL (ref 0.320–4.118)

## 2018-02-02 LAB — HEPATITIS B SURFACE ANTIGEN: Hepatitis B Surface Ag: NEGATIVE

## 2018-02-13 ENCOUNTER — Other Ambulatory Visit: Payer: Self-pay | Admitting: Hematology

## 2018-02-13 ENCOUNTER — Ambulatory Visit (HOSPITAL_BASED_OUTPATIENT_CLINIC_OR_DEPARTMENT_OTHER)
Admission: RE | Admit: 2018-02-13 | Discharge: 2018-02-13 | Disposition: A | Discharge status: Home / Self Care | Payer: Medicare Other | Source: Ambulatory Visit | Attending: Hematology | Admitting: Hematology

## 2018-02-13 DIAGNOSIS — D696 Thrombocytopenia, unspecified: Secondary | ICD-10-CM | POA: Insufficient documentation

## 2018-02-15 ENCOUNTER — Other Ambulatory Visit: Payer: Self-pay | Admitting: Hematology

## 2018-02-15 ENCOUNTER — Telehealth: Payer: Self-pay | Admitting: *Deleted

## 2018-02-15 DIAGNOSIS — N2889 Other specified disorders of kidney and ureter: Secondary | ICD-10-CM

## 2018-02-15 NOTE — Telephone Encounter (Signed)
-----   Message from Tish Men, MD sent at 02/13/2018  4:02 PM EST ----- Regarding: Abdominal US results. Can we let Mr. Westhoff know that his abdominal US showed a cystic structure in the left kidney and the radiologist recommended getting an abdominal MRI to better assess it?   Thank you.  [place] ----- Message ----- From: Interface, Rad Results In Sent: 02/13/2018   3:43 PM EST To: Tish Men, MD

## 2018-02-15 NOTE — Telephone Encounter (Signed)
As noted below by Dr. Maylon Peppers, I informed patient of the abdominal US results showing a cystic structure in the left kidney. The radiologist recommends an abdominal MRI to better assess the area. Dr. Maylon Peppers is going to order the MRI and someone from scheduling will be calling you to arrange date and time. He verbalized understanding.

## 2018-02-16 ENCOUNTER — Other Ambulatory Visit: Payer: Self-pay | Admitting: Family

## 2018-02-16 ENCOUNTER — Telehealth: Payer: Self-pay | Admitting: Hematology & Oncology

## 2018-02-16 DIAGNOSIS — N281 Cyst of kidney, acquired: Secondary | ICD-10-CM

## 2018-02-17 ENCOUNTER — Ambulatory Visit (HOSPITAL_BASED_OUTPATIENT_CLINIC_OR_DEPARTMENT_OTHER)
Admission: RE | Admit: 2018-02-17 | Discharge: 2018-02-17 | Disposition: A | Discharge status: Home / Self Care | Payer: Medicare Other | Source: Ambulatory Visit | Attending: Family | Admitting: Family

## 2018-02-17 ENCOUNTER — Ambulatory Visit (HOSPITAL_BASED_OUTPATIENT_CLINIC_OR_DEPARTMENT_OTHER): Payer: Medicare Other

## 2018-02-17 DIAGNOSIS — N281 Cyst of kidney, acquired: Secondary | ICD-10-CM | POA: Insufficient documentation

## 2018-02-17 MED ORDER — GADOBUTROL 1 MMOL/ML IV SOLN
10.0000 mL | Freq: Once | INTRAVENOUS | Status: AC | PRN
  Administered 2018-02-17: 10 mL via INTRAVENOUS

## 2018-02-21 ENCOUNTER — Ambulatory Visit (HOSPITAL_COMMUNITY): Payer: Medicare Other

## 2018-02-27 ENCOUNTER — Other Ambulatory Visit: Payer: Self-pay | Admitting: Medical

## 2018-03-08 ENCOUNTER — Other Ambulatory Visit: Payer: Self-pay

## 2018-03-08 ENCOUNTER — Inpatient Hospital Stay (HOSPITAL_BASED_OUTPATIENT_CLINIC_OR_DEPARTMENT_OTHER): Payer: Medicare Other | Admitting: Hematology

## 2018-03-08 ENCOUNTER — Encounter: Payer: Self-pay | Admitting: Hematology

## 2018-03-08 ENCOUNTER — Inpatient Hospital Stay: Payer: Medicare Other | Attending: Hematology

## 2018-03-08 VITALS — BP 119/62 | HR 63 | Temp 98.0°F | Resp 19 | Ht 75.0 in | Wt 184.2 lb

## 2018-03-08 DIAGNOSIS — N281 Cyst of kidney, acquired: Secondary | ICD-10-CM | POA: Insufficient documentation

## 2018-03-08 DIAGNOSIS — N2889 Other specified disorders of kidney and ureter: Secondary | ICD-10-CM | POA: Diagnosis not present

## 2018-03-08 DIAGNOSIS — Z7982 Long term (current) use of aspirin: Secondary | ICD-10-CM

## 2018-03-08 DIAGNOSIS — D696 Thrombocytopenia, unspecified: Secondary | ICD-10-CM | POA: Diagnosis present

## 2018-03-08 DIAGNOSIS — Z79899 Other long term (current) drug therapy: Secondary | ICD-10-CM

## 2018-03-08 DIAGNOSIS — R634 Abnormal weight loss: Secondary | ICD-10-CM

## 2018-03-08 HISTORY — DX: Cyst of kidney, acquired: N28.1

## 2018-03-08 LAB — CBC WITH DIFFERENTIAL (CANCER CENTER ONLY)
Abs Immature Granulocytes: 0.02 10*3/uL (ref 0.00–0.07)
BASOS ABS: 0 10*3/uL (ref 0.0–0.1)
Basophils Relative: 0 %
Eosinophils Absolute: 0.1 10*3/uL (ref 0.0–0.5)
Eosinophils Relative: 2 %
HCT: 45.7 % (ref 39.0–52.0)
Hemoglobin: 14.7 g/dL (ref 13.0–17.0)
Immature Granulocytes: 0 %
LYMPHS PCT: 31 %
Lymphs Abs: 1.6 10*3/uL (ref 0.7–4.0)
MCH: 30.6 pg (ref 26.0–34.0)
MCHC: 32.2 g/dL (ref 30.0–36.0)
MCV: 95 fL (ref 80.0–100.0)
Monocytes Absolute: 0.5 10*3/uL (ref 0.1–1.0)
Monocytes Relative: 11 %
NRBC: 0 % (ref 0.0–0.2)
Neutro Abs: 2.8 10*3/uL (ref 1.7–7.7)
Neutrophils Relative %: 56 %
Platelet Count: 132 10*3/uL — ABNORMAL LOW (ref 150–400)
RBC: 4.81 MIL/uL (ref 4.22–5.81)
RDW: 12.4 % (ref 11.5–15.5)
WBC Count: 5.1 10*3/uL (ref 4.0–10.5)

## 2018-03-08 NOTE — Progress Notes (Signed)
[place] OFFICE PROGRESS NOTE  Patient Care Team: [name], DO as PCP - General (Family Medicine) Constance Haw, MD as PCP - Electrophysiology (Cardiology)  HEME/ONC OVERVIEW: 1. Mild thrombocytopenia -Plts ~130-140k dating back to 2017; nl WBC and Hgb  -Viral and nutritional studies unremarkable except borderline low B12; normal liver and spleen on [place]  2. Complex left renal cyst -Incidentally noted on abdominal US in 01/2018; MRI abdomen showed Bosniak IIF lesion, q64month MRI monitoring per radiology  ASSESSMENT & PLAN  Mild thrombocytopenia -Chronic, stable since at least 2017 -Work-up, including viral and nutritional studies, was unremarkable -Abdominal [place]owed normal liver and spleen -Plts 132k today; nl WBC and Hgb -Patient denies any symptoms of bleeding or bruising -In light of stable mild thrombocytopenia and absence of other cytopenias, I do not see any urgent indication for further work-up at this time, such as bone marrow biopsy -I counseled the patient on any concerning symptoms, including unexplained fever, night sweats, unexplained/persistent weight loss, lymphadenopathy, or abnormal bleeding bruising, for which he should seek care promptly  Complex left renal cyst -I independently reviewed the radiologic images of her recent MRI abdomen, and agree with the findings documented, -In summary, abdominal ultrasound showed an incidental complex left lesion in 01/2018.  MRI abdomen showed a 1.5 x 1.1 x 1.1 cm Bosniak category 59F cyst in the left kidney, and the second small nonenhancing cystic lesion below the left diaphragm, compatible with a cyst -Per radiology recommendation, we will repeat MRI abdomen in 6 months to monitor for any interval change  Intentional weight loss -Patient reports losing 5 to 6 pounds over the past 3 weeks after he modified his diet and cut out high-sugar content -He denies any other constitutional  symptoms -He is up-to-date with colonoscopy -I suspect that his mild weight loss is mostly due to dietary modifications; however, I encouraged patient to follow-up with PCP if he develops persistent unexplained weight loss or other suspicious constitutional symptoms  Orders Placed This Encounter  Procedures  . MR Abdomen W Wo Contrast    Standing Status:   Future    Standing Expiration Date:   05/07/2019    Order Specific Question:   ** REASON FOR EXAM (FREE TEXT)    Answer:   Complex left renal lesion, repeat MRI 6 months per radiology recommendation    Order Specific Question:   If indicated for the ordered procedure, I authorize the administration of contrast media per Radiology protocol    Answer:   Yes    Order Specific Question:   What is the patient's sedation requirement?    Answer:   No Sedation    Order Specific Question:   Does the patient have a pacemaker or implanted devices?    Answer:   No    Order Specific Question:   Radiology Contrast Protocol - do NOT remove file path    Answer:   [email or web address]    Order Specific Question:   [facility]?    [facility]  . CBC with Differential (Cancer Center Only)    Standing Status:   Future    Standing Expiration Date:   04/12/2019  . CMP ([facility] [place])    Standing Status:   Future    Standing Expiration Date:   04/12/2019  . Lactate dehydrogenase    Standing Status:   Future    Standing Expiration Date:   04/12/2019  A total of more than 25 minutes were spent face-to-face with the patient during this encounter and over half of that time was spent on counseling and coordination of care as outlined above.  All questions were answered. The patient knows to call the clinic with any problems, questions or concerns. No barriers to learning was detected.  Return in 6 months for labs, scans and clinic follow-up.  Tish Men, MD 03/08/2018 2:21  PM  CHIEF COMPLAINT: "I am doing well"  INTERVAL HISTORY: Mr. Justin Frazier returns to clinic for follow-up of chronic thrombocytopenia.  He reports that over the past several weeks, he had cut out high sugar content from his diet, and has lost about 5 to 6 pounds.  He denies any other complaint today, such as fever, night sweats, lymphadenopathy, chest pain, dyspnea, abdominal pain, nausea, vomiting, diarrhea, or abnormal bleeding/bruising.  REVIEW OF SYSTEMS:   Constitutional: ( - ) fevers, ( - )  chills , ( - ) night sweats Eyes: ( - ) blurriness of vision, ( - ) double vision, ( - ) watery eyes Ears, nose, mouth, throat, and face: ( - ) mucositis, ( - ) sore throat Respiratory: ( - ) cough, ( - ) dyspnea, ( - ) wheezes Cardiovascular: ( - ) palpitation, ( - ) chest discomfort, ( - ) lower extremity swelling Gastrointestinal:  ( - ) nausea, ( - ) heartburn, ( - ) change in bowel habits Skin: ( - ) abnormal skin rashes Lymphatics: ( - ) new lymphadenopathy, ( - ) easy bruising Neurological: ( - ) numbness, ( - ) tingling, ( - ) new weaknesses Behavioral/Psych: ( - ) mood change, ( - ) new changes  All other systems were reviewed with the patient and are negative.  I have reviewed the past medical history, past surgical history, social history and family history with the patient and they are unchanged from previous note.  ALLERGIES:  has No Known Allergies.  MEDICATIONS:  Current Outpatient Medications  Medication Sig Dispense Refill  . albuterol (PROVENTIL HFA;VENTOLIN HFA) 108 (90 Base) MCG/ACT inhaler Inhale 2 puffs into the lungs every 6 (six) hours as needed. Every 4-6 hours as needed    . aspirin EC 81 MG tablet Take 81 mg by mouth daily.     . Cholecalciferol 5000 units TABS Take 5,000 mg by mouth daily.    . dorzolamide-timolol (COSOPT) 22.3-6.8 MG/ML ophthalmic solution Place 1 drop into both eyes 2 (two) times daily.    . Fluticasone Furoate (ARNUITY ELLIPTA) 50 MCG/ACT AEPB  Inhale 1 puff into the lungs 2 (two) times daily. 1 each 0  . levocetirizine (XYZAL) 5 MG tablet TAKE 1 TABLET BY MOUTH EVERY DAY IN THE EVENING 30 tablet 0  . lisinopril (PRINIVIL,ZESTRIL) 10 MG tablet Take 1 tablet (10 mg total) by mouth daily. 90 tablet 2  . metoprolol succinate (TOPROL-XL) 25 MG 24 hr tablet Take 1 tablet (25 mg total) by mouth daily. 90 tablet 2  . montelukast (SINGULAIR) 10 MG tablet TAKE 1 TABLET BY MOUTH EVERYDAY AT BEDTIME 90 tablet 0  . omeprazole (PRILOSEC) 20 MG capsule TAKE 1 CAPSULE BY MOUTH EVERY DAY 90 capsule 1  . predniSONE (DELTASONE) 10 MG tablet 4 tabs by mouth once daily for 5 days 20 tablet 0  . traMADol (ULTRAM) 50 MG tablet Take 2 tabs every 6 hours as needed for moderate-severe pain. Not to exceed 4 tabs daily. 30 tablet 3  . travoprost, benzalkonium, (TRAVATAN) 0.004 % ophthalmic solution  Place 1 drop into both eyes at bedtime.    . vitamin B-12 (CYANOCOBALAMIN) 1000 MCG tablet Take 1,000 mcg by mouth daily.     No current facility-administered medications for this visit.     PHYSICAL EXAMINATION: ECOG PERFORMANCE STATUS: 0 - Asymptomatic  Today's Vitals   03/08/18 1353  BP: 119/62  Pulse: 63  Resp: 19  Temp: 98 F (36.7 C)  TempSrc: Oral  SpO2: 100%  Weight: 184 lb 4 oz (83.6 kg)  Height: _0  (1.905 m)  PainSc: 0-No pain   Body mass index is 23.03 kg/m.  Filed Weights   03/08/18 1353  Weight: 184 lb 4 oz (83.6 kg)    GENERAL: alert, no distress and comfortable, well appearing SKIN: skin color, texture, turgor are normal, no rashes or significant lesions EYES: conjunctiva are pink and non-injected, sclera clear OROPHARYNX: no exudate, no erythema; lips, buccal mucosa, and tongue normal  NECK: supple, non-tender LUNGS: clear to auscultation with normal breathing effort HEART: regular rate & rhythm and no murmurs and no lower extremity edema ABDOMEN: soft, non-tender, non-distended, normal bowel sounds Musculoskeletal: no  cyanosis of digits and no clubbing  PSYCH: alert & oriented x 3, fluent speech NEURO: no focal motor/sensory deficits  LABORATORY DATA:  I have reviewed the data as listed    Component Value Date/Time   NA 140 02/01/2018 1203   K 3.6 02/01/2018 1203   CL 105 02/01/2018 1203   CO2 28 02/01/2018 1203   GLUCOSE 104 (H) 02/01/2018 1203   BUN 18 02/01/2018 1203   CREATININE 1.22 02/01/2018 1203   CALCIUM 9.4 02/01/2018 1203   PROT 6.8 02/01/2018 1203   ALBUMIN 4.4 02/01/2018 1203   AST 18 02/01/2018 1203   ALT 9 02/01/2018 1203   ALKPHOS 64 02/01/2018 1203   BILITOT 1.2 02/01/2018 1203   GFRNONAA >60 02/01/2018 1203   GFRAA >60 02/01/2018 1203    No results found for: SPEP, UPEP  Lab Results  Component Value Date   WBC 5.1 03/08/2018   NEUTROABS 2.8 03/08/2018   HGB 14.7 03/08/2018   HCT 45.7 03/08/2018   MCV 95.0 03/08/2018   PLT 132 (L) 03/08/2018      Chemistry      Component Value Date/Time   NA 140 02/01/2018 1203   K 3.6 02/01/2018 1203   CL 105 02/01/2018 1203   CO2 28 02/01/2018 1203   BUN 18 02/01/2018 1203   CREATININE 1.22 02/01/2018 1203      Component Value Date/Time   CALCIUM 9.4 02/01/2018 1203   ALKPHOS 64 02/01/2018 1203   AST 18 02/01/2018 1203   ALT 9 02/01/2018 1203   BILITOT 1.2 02/01/2018 1203       RADIOGRAPHIC STUDIES: I have personally reviewed the radiological images as listed below and agreed with the findings in the report. Mr Abdomen W Wo Contrast  Result Date: 02/17/2018 CLINICAL DATA:  Abdominal pain for 6 months with weight loss. Complex cystic lesion of the left kidney seen on ultrasound. EXAM: MRI ABDOMEN WITHOUT AND WITH CONTRAST TECHNIQUE: Multiplanar multisequence MR imaging of the abdomen was performed both before and after the administration of intravenous contrast. CONTRAST:  10 cc Gadavist COMPARISON:  02/13/2018 ultrasound FINDINGS: Lower chest: Unremarkable Hepatobiliary: Unremarkable Pancreas:  Unremarkable Spleen:   Unremarkable Adrenals/Urinary Tract:  Adrenal glands normal. In the left mid upper kidney and corresponding to the finding at sonography, a 1.5 by 1.1 by 1.1 cm septated cystic lesion is present. This appears to  potentially have more than 1 septation and has visible but not measurable enhancement along the septa. Given the capability of today's exam to characterize this lesion and its overall features, this considered a Bosniak category IIF cyst. Smaller cysts of the right kidney are technically too small to characterize although statistically likely to be benign. The adrenal glands appear normal. Stomach/Bowel: Unremarkable Vascular/Lymphatic:  Unremarkable Other: Just below the left hemidiaphragm there is a 1.3 by 1.2 by 0.7 cm nonenhancing cystic lesion. Musculoskeletal: Unremarkable IMPRESSION: 1. A 1.5 by 1.1 by 1.1 cm Bosniak category IIF cyst of the left mid upper kidney is present laterally. This has internal septations with perceptible but not measurable enhancement. This lesion is statistically likely to be benign but technically nonspecific. Renal protocol CT or MRI in 6 months time is recommended. This recommendation follows ACR consensus guidelines: Management of the Incidental Renal Mass on CT: A White Paper of the ACR Incidental Findings Committee. J Am Coll Radiol [phone]. 2. 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 Electronically Signed   By: Van Clines M.D.   On: 02/17/2018 11:36   US Abdomen Complete  Result Date: 02/13/2018 CLINICAL DATA:  Thrombus cytopenia, chronic kidney disease, hypertension. EXAM: ABDOMEN ULTRASOUND COMPLETE COMPARISON:  None. FINDINGS: Gallbladder: No gallstones or wall thickening visualized. No sonographic Murphy sign noted by sonographer. Common bile duct: Diameter: 3.9 mm Liver: No focal lesion identified.  Within normal limits in parenchymal echogenicity. Portal vein is patent on color Doppler imaging with normal direction of blood flow towards the liver. IVC: No abnormality visualized. Pancreas: Visualized portion unremarkable. Spleen: The spleen measures 5.5 cm in length and is normal in echotexture. Right Kidney: Length: 9.8 cm. The renal cortical echotexture is increased as compared to the adjacent liver. There is no focal mass nor hydronephrosis. Left Kidney: Length: 9.3 cm. Mildly increased renal cortical echotexture similar to that on the right. In the midpole cortex there is a 1.7 x 1.8 x 1.7 cm cystic structure containing partial septations and other internal echoes. There is no hydronephrosis. Abdominal aorta: No aneurysm visualized. Other findings: There is no ascites. IMPRESSION: Normal size and echotexture of the spleen. Normal appearance of the liver, gallbladder, and pancreas. Increased renal cortical echotexture of both kidneys consistent with medical renal disease. No hydronephrosis. There is a complex cystic structure in the midpole of the left kidney measuring 1.8 cm in greatest dimension consistent with a Bosniak IIF lesion. Further evaluation with renal protocol MRI is recommended. Electronically Signed   By: David  Martinique M.D.   On: 02/13/2018 15:41

## 2018-03-12 ENCOUNTER — Other Ambulatory Visit: Payer: Self-pay | Admitting: Family Medicine

## 2018-03-27 ENCOUNTER — Other Ambulatory Visit: Payer: Self-pay | Admitting: Family Medicine

## 2018-03-30 ENCOUNTER — Other Ambulatory Visit: Payer: Self-pay | Admitting: Family Medicine

## 2018-05-11 ENCOUNTER — Telehealth: Payer: Self-pay | Admitting: *Deleted

## 2018-05-11 NOTE — Telephone Encounter (Signed)
Called patient to let them know due to recent [place] and Health Department Protocols, we are not seeing patients in the office on Tuesday in [place]. Patient was agreeable with using WebEx . WebEx App Information sent Via MyChart.

## 2018-05-15 ENCOUNTER — Other Ambulatory Visit: Payer: Self-pay

## 2018-05-15 ENCOUNTER — Telehealth: Payer: Medicare Other | Admitting: Cardiology

## 2018-06-26 ENCOUNTER — Other Ambulatory Visit: Payer: Self-pay | Admitting: Family Medicine

## 2018-06-29 ENCOUNTER — Other Ambulatory Visit: Payer: Self-pay | Admitting: Family Medicine

## 2018-06-29 MED ORDER — LISINOPRIL 10 MG PO TABS: 10.0000 mg | ORAL_TABLET | Freq: Every day | ORAL | 0 refills | Status: DC

## 2018-07-10 ENCOUNTER — Encounter: Payer: Self-pay | Admitting: Family Medicine

## 2018-07-10 ENCOUNTER — Ambulatory Visit (INDEPENDENT_AMBULATORY_CARE_PROVIDER_SITE_OTHER): Payer: Medicare Other | Admitting: Family Medicine

## 2018-07-10 ENCOUNTER — Other Ambulatory Visit: Payer: Self-pay

## 2018-07-10 DIAGNOSIS — L0292 Furuncle, unspecified: Secondary | ICD-10-CM

## 2018-07-10 DIAGNOSIS — J4520 Mild intermittent asthma, uncomplicated: Secondary | ICD-10-CM

## 2018-07-10 DIAGNOSIS — I10 Essential (primary) hypertension: Secondary | ICD-10-CM

## 2018-07-10 HISTORY — DX: Mild intermittent asthma, uncomplicated: J45.20

## 2018-07-10 MED ORDER — MONTELUKAST SODIUM 10 MG PO TABS: ORAL_TABLET | ORAL | 2 refills | Status: DC

## 2018-07-10 MED ORDER — MUPIROCIN 2 % EX OINT: 1.0000 "application " | TOPICAL_OINTMENT | Freq: Two times a day (BID) | CUTANEOUS | 0 refills | Status: DC

## 2018-07-10 NOTE — Progress Notes (Signed)
Chief Complaint  Patient presents with  . Bump on thigh and needs refill for singulair    Justin Frazier is a 58 y.o. male here for a skin complaint. Due to COVID-19 pandemic, we are interacting via web portal for an electronic face-to-face visit. I verified patient's ID using 2 identifiers. Patient agreed to proceed with visit via this method. Patient is at home, I am at home. Patient and I are present for visit.   Duration: 2 weeks Location: inner thigh Pruritic? Yes Painful? No Drainage? No New soaps/lotions/topicals/detergents? No Sick contacts? No Other associated symptoms: white head Therapies tried thus far: anti-itch cream  Asthma/allergies controlled with Singulair. Reports compliance, no AE's.   Hypertension Patient presents for hypertension follow up. He does monitor home blood pressures. Blood pressures ranging on average from 120-130's/70's. He is compliant with medications. Patient has these side effects of medication: none He is adhering to a healthy diet overall.   ROS:  Const: No fevers Skin: As noted in HPI  Past Medical History:  Diagnosis Date  . Abnormal EKG 03/20/2017  . Allergic rhinitis   . Carpal tunnel syndrome   . Central serous retinopathy   . CKD (chronic kidney disease), stage I   . Glaucoma   . Lumbar strain   . NSVT (nonsustained ventricular tachycardia) ([place])    Seen by Dr. Mina Marble (EP) recommended beta blocker therapy (2014)  . Proteinuria   . Sciatica of left side    Exam No conversational dyspnea Age appropriate judgment and insight Nml affect and mood  Furuncle - Plan: mupirocin ointment (BACTROBAN) 2 %  Mild intermittent asthma without complication - Plan: montelukast (SINGULAIR) 10 MG tablet  Essential hypertension  1- sounds like a furuncle, not an abscess or cellulitis given lack of growth/pain. TAO for 7-10 d. Let us know if anything is changing. 2- Refill as above 3- counseled on diet and exercise. Cut out junk food,  has lost a few lbs. He is somewhat concerned, but I am not given his recent workup with heme/onc, lack of risk factors and steady loss. He has a reason for this as well.  F/u in 6 mo for CPE or prn. The patient voiced understanding and agreement to the plan.  [place], DO 07/10/18 3:26 PM

## 2018-07-11 ENCOUNTER — Telehealth: Payer: Self-pay | Admitting: Family Medicine

## 2018-07-11 NOTE — Telephone Encounter (Signed)
LMOVM for pt to call office back- Return in about 6 months (around 01/10/2019) for CPE-to schedule this appt

## 2018-07-24 ENCOUNTER — Telehealth: Payer: Self-pay

## 2018-07-24 NOTE — Telephone Encounter (Signed)
Made appt already with another MD

## 2018-07-24 NOTE — Telephone Encounter (Signed)
Copied from [place] [id number]. Topic: General - Other >> Jul 23, 2018  4:04 PM Pauline Good wrote: Reason for CRM: pt want to sched appt for weight loss. Please call to schedule

## 2018-07-27 ENCOUNTER — Encounter: Payer: Self-pay | Admitting: Hematology

## 2018-07-27 ENCOUNTER — Inpatient Hospital Stay (HOSPITAL_BASED_OUTPATIENT_CLINIC_OR_DEPARTMENT_OTHER): Payer: Medicare Other | Admitting: Hematology

## 2018-07-27 ENCOUNTER — Inpatient Hospital Stay: Payer: Medicare Other | Attending: Hematology

## 2018-07-27 ENCOUNTER — Other Ambulatory Visit: Payer: Self-pay

## 2018-07-27 VITALS — BP 127/62 | HR 43 | Resp 17

## 2018-07-27 DIAGNOSIS — Z7982 Long term (current) use of aspirin: Secondary | ICD-10-CM | POA: Insufficient documentation

## 2018-07-27 DIAGNOSIS — R634 Abnormal weight loss: Secondary | ICD-10-CM | POA: Diagnosis not present

## 2018-07-27 DIAGNOSIS — D696 Thrombocytopenia, unspecified: Secondary | ICD-10-CM

## 2018-07-27 DIAGNOSIS — Z79899 Other long term (current) drug therapy: Secondary | ICD-10-CM

## 2018-07-27 DIAGNOSIS — Z87891 Personal history of nicotine dependence: Secondary | ICD-10-CM | POA: Diagnosis not present

## 2018-07-27 DIAGNOSIS — N281 Cyst of kidney, acquired: Secondary | ICD-10-CM

## 2018-07-27 LAB — CBC WITH DIFFERENTIAL (CANCER CENTER ONLY)
Abs Immature Granulocytes: 0.02 10*3/uL (ref 0.00–0.07)
Basophils Absolute: 0 10*3/uL (ref 0.0–0.1)
Basophils Relative: 1 %
Eosinophils Absolute: 0.1 10*3/uL (ref 0.0–0.5)
Eosinophils Relative: 1 %
HCT: 43 % (ref 39.0–52.0)
Hemoglobin: 13.8 g/dL (ref 13.0–17.0)
Immature Granulocytes: 1 %
Lymphocytes Relative: 38 %
Lymphs Abs: 1.6 10*3/uL (ref 0.7–4.0)
MCH: 31 pg (ref 26.0–34.0)
MCHC: 32.1 g/dL (ref 30.0–36.0)
MCV: 96.6 fL (ref 80.0–100.0)
Monocytes Absolute: 0.5 10*3/uL (ref 0.1–1.0)
Monocytes Relative: 11 %
Neutro Abs: 2.1 10*3/uL (ref 1.7–7.7)
Neutrophils Relative %: 48 %
Platelet Count: 130 10*3/uL — ABNORMAL LOW (ref 150–400)
RBC: 4.45 MIL/uL (ref 4.22–5.81)
RDW: 13.2 % (ref 11.5–15.5)
WBC Count: 4.3 10*3/uL (ref 4.0–10.5)
nRBC: 0 % (ref 0.0–0.2)

## 2018-07-27 LAB — CMP (CANCER CENTER ONLY)
ALT: 6 U/L (ref 0–44)
AST: 11 U/L — ABNORMAL LOW (ref 15–41)
Albumin: 4.3 g/dL (ref 3.5–5.0)
Alkaline Phosphatase: 49 U/L (ref 38–126)
Anion gap: 7 (ref 5–15)
BUN: 8 mg/dL (ref 6–20)
CO2: 29 mmol/L (ref 22–32)
Calcium: 9.8 mg/dL (ref 8.9–10.3)
Chloride: 105 mmol/L (ref 98–111)
Creatinine: 1.14 mg/dL (ref 0.61–1.24)
GFR, Est AFR Am: >60 mL/min (ref >60)
GFR, Estimated: >60 mL/min (ref >60)
Glucose, Bld: 91 mg/dL (ref 70–99)
Potassium: 4.1 mmol/L (ref 3.5–5.1)
Sodium: 141 mmol/L (ref 135–145)
Total Bilirubin: 1 mg/dL (ref 0.3–1.2)
Total Protein: 6.7 g/dL (ref 6.5–8.1)

## 2018-07-27 LAB — T4, FREE: Free T4: 0.84 ng/dL (ref 0.61–1.12)

## 2018-07-27 NOTE — Progress Notes (Signed)
[facility] OFFICE PROGRESS NOTE  Patient Care Team: Justin Pal, DO as PCP - General (Family Medicine) Justin Haw, MD as PCP - Electrophysiology (Cardiology)  HEME/ONC OVERVIEW: 1. Mild thrombocytopenia -Plts ~130-140k dating back to 2017; nl WBC and Hgb  -Viral and nutritional studies unremarkable except borderline low B12; normal liver and spleen on [place]  2. Complex left renal cyst -Incidentally noted on abdominal US in 01/2018; MRI abdomen showed Bosniak IIF lesion, q58month MRI monitoring per radiology  ASSESSMENT & PLAN  Mild thrombocytopenia -Stable since 2017 -Extensive work-up, including viral and nutritional studies, as well as liver imaging, was unremarkable -Plts 130k today, stable; nl WBC and Hgb -Patient denies any symptoms of bleeding or bruising -Given the chronic stable thrombocytopenia without any anemia or thrombocytopenia, I do not see any urgent indication for further work-up at this time, such as bone marrow biopsy -Patient can continue routine follow-up with his PCP and monitor for any changes in the platelet count -I counseled the patient on any concerning symptoms, such as unexplained fever, night sweats, lymphadenopathy, or abnormal bleeding/bruising, for which he should seek care promptly  Complex left renal cyst -Bosniak category IIF based on MRI abdomen in 01/2018 -Per radiology recommendation, MRI abdomen should be repeated in 6 months (ie 07/2018) -MRI abdomen was ordered but not yet scheduled; we will try to expedite the scheduling  -If unremarkable, he can continue follow-up with PCP for the monitoring of the renal cyst   Weight loss -Patient reports losing 10 lbs over the past 6 months; no other constitutional symptoms -He reports being up-to-date with colonoscopy ([place] ago [place] -Given the borderline low free T4 in 01/2018, I have ordered repeat thyroid function study -In light of his hx of smoking, we  briefly discussed lung cancer screening; in addition, he requested to be tested for Lyme disease -I discussed with the patient as this is most likely unrelated to his mild thrombocytopenia, he should contact his PCP for further evaluation -Patient expressed understanding, and agreed with the plan   Orders Placed This Encounter  Procedures  . TSH    Standing Status:   Future    Number of Occurrences:   1    Standing Expiration Date:   07/27/2019  . T4, free    Standing Status:   Future    Number of Occurrences:   1    Standing Expiration Date:   07/27/2019   All questions were answered. The patient knows to call the clinic with any problems, questions or concerns. No barriers to learning was detected.  A total of more than 25 minutes were spent face-to-face with the patient during this encounter and over half of that time was spent on counseling and coordination of care as outlined above.   Return as needed. Patient will continue routine follow-up with PCP and work-up of his weight loss.   Justin Men MD 07/27/2018 2:48 PM  CHIEF COMPLAINT: "I keep losing weight"  INTERVAL HISTORY: Mr. PJanczakreturns to clinic for follow-up of chronic thrombocytopenia.  Patient reports that he has lost approximately over the past 6 months despite having normal appetite and no change in his diet.  He denies any abnormal bleeding or bruising, such as hematochezia, melena, hematuria, hemoptysis, or hematemesis.  He denies any other complaint today.  REVIEW OF SYSTEMS:   Constitutional: ( - ) fevers, ( - )  chills , ( - ) night sweats Eyes: ( - ) blurriness of  vision, ( - ) double vision, ( - ) watery eyes Ears, nose, mouth, throat, and face: ( - ) mucositis, ( - ) sore throat Respiratory: ( - ) cough, ( - ) dyspnea, ( - ) wheezes Cardiovascular: ( - ) palpitation, ( - ) chest discomfort, ( - ) lower extremity swelling Gastrointestinal:  ( - ) nausea, ( - ) heartburn, ( - ) change in bowel habits Skin: (  - ) abnormal skin rashes Lymphatics: ( - ) new lymphadenopathy, ( - ) easy bruising Neurological: ( - ) numbness, ( - ) tingling, ( - ) new weaknesses Behavioral/Psych: ( - ) mood change, ( - ) new changes  All other systems were reviewed with the patient and are negative.  I have reviewed the past medical history, past surgical history, social history and family history with the patient and they are unchanged from previous note.  ALLERGIES:  has No Known Allergies.  MEDICATIONS:  Current Outpatient Medications  Medication Sig Dispense Refill  . albuterol (PROVENTIL HFA;VENTOLIN HFA) 108 (90 Base) MCG/ACT inhaler Inhale 2 puffs into the lungs every 6 (six) hours as needed. Every 4-6 hours as needed    . aspirin EC 81 MG tablet Take 81 mg by mouth daily.     . Cholecalciferol 5000 units TABS Take 5,000 mg by mouth daily.    . dorzolamide-timolol (COSOPT) 22.3-6.8 MG/ML ophthalmic solution Place 1 drop into both eyes 2 (two) times daily.    . Fluticasone Furoate (ARNUITY ELLIPTA) 50 MCG/ACT AEPB Inhale 1 puff into the lungs 2 (two) times daily. 1 each 0  . levocetirizine (XYZAL) 5 MG tablet TAKE 1 TABLET BY MOUTH EVERY DAY IN THE EVENING 90 tablet 1  . lisinopril (ZESTRIL) 10 MG tablet     . metoprolol succinate (TOPROL-XL) 25 MG 24 hr tablet     . montelukast (SINGULAIR) 10 MG tablet     . mupirocin ointment (BACTROBAN) 2 % Place 1 application into the nose 2 (two) times daily. 22 g 0  . mupirocin ointment (BACTROBAN) 2 %     . omeprazole (PRILOSEC) 20 MG capsule TAKE 1 CAPSULE BY MOUTH EVERY DAY 90 capsule 1  . traMADol (ULTRAM) 50 MG tablet Take 2 tabs every 6 hours as needed for moderate-severe pain. Not to exceed 4 tabs daily. 30 tablet 3  . travoprost, benzalkonium, (TRAVATAN) 0.004 % ophthalmic solution Place 1 drop into both eyes at bedtime.    . vitamin B-12 (CYANOCOBALAMIN) 1000 MCG tablet Take 1,000 mcg by mouth daily.     No current facility-administered medications for this  visit.     PHYSICAL EXAMINATION: ECOG PERFORMANCE STATUS: 1 - Symptomatic but completely ambulatory  Today's Vitals   07/27/18 1414  BP: 127/62  Pulse: (!) 43  Resp: 17  SpO2: 100%   There is no height or weight on file to calculate BMI.  There were no vitals filed for this visit.  GENERAL: alert, no distress and comfortable SKIN: skin color, texture, turgor are normal, no rashes or significant lesions EYES: conjunctiva are pink and non-injected, sclera clear OROPHARYNX: no exudate, no erythema; lips, buccal mucosa, and tongue normal  NECK: supple, non-tender LUNGS: clear to auscultation with normal breathing effort HEART: regular rate & rhythm and no murmurs and no lower extremity edema ABDOMEN: soft, non-tender, non-distended, normal bowel sounds Musculoskeletal: no cyanosis of digits and no clubbing  PSYCH: alert & oriented x 3, fluent speech NEURO: no focal motor/sensory deficits  LABORATORY DATA:  I  have reviewed the data as listed    Component Value Date/Time   NA 141 07/27/2018 1339   K 4.1 07/27/2018 1339   CL 105 07/27/2018 1339   CO2 29 07/27/2018 1339   GLUCOSE 91 07/27/2018 1339   BUN 8 07/27/2018 1339   CREATININE 1.14 07/27/2018 1339   CALCIUM 9.8 07/27/2018 1339   PROT 6.7 07/27/2018 1339   ALBUMIN 4.3 07/27/2018 1339   AST 11 (L) 07/27/2018 1339   ALT 6 07/27/2018 1339   ALKPHOS 49 07/27/2018 1339   BILITOT 1.0 07/27/2018 1339   GFRNONAA >60 07/27/2018 1339   GFRAA >60 07/27/2018 1339    No results found for: SPEP, UPEP  Lab Results  Component Value Date   WBC 4.3 07/27/2018   NEUTROABS 2.1 07/27/2018   HGB 13.8 07/27/2018   HCT 43.0 07/27/2018   MCV 96.6 07/27/2018   PLT 130 (L) 07/27/2018      Chemistry      Component Value Date/Time   NA 141 07/27/2018 1339   K 4.1 07/27/2018 1339   CL 105 07/27/2018 1339   CO2 29 07/27/2018 1339   BUN 8 07/27/2018 1339   CREATININE 1.14 07/27/2018 1339      Component Value Date/Time    CALCIUM 9.8 07/27/2018 1339   ALKPHOS 49 07/27/2018 1339   AST 11 (L) 07/27/2018 1339   ALT 6 07/27/2018 1339   BILITOT 1.0 07/27/2018 1339

## 2018-07-30 ENCOUNTER — Other Ambulatory Visit: Payer: Self-pay

## 2018-07-30 ENCOUNTER — Telehealth: Payer: Self-pay | Admitting: Hematology

## 2018-07-30 ENCOUNTER — Ambulatory Visit (INDEPENDENT_AMBULATORY_CARE_PROVIDER_SITE_OTHER): Payer: Medicare Other | Admitting: Family Medicine

## 2018-07-30 ENCOUNTER — Encounter: Payer: Self-pay | Admitting: Family Medicine

## 2018-07-30 DIAGNOSIS — R634 Abnormal weight loss: Secondary | ICD-10-CM

## 2018-07-30 LAB — LACTATE DEHYDROGENASE: LDH: 185 U/L (ref 98–192)

## 2018-07-30 LAB — TSH: TSH: 1.207 u[IU]/mL (ref 0.320–4.118)

## 2018-07-30 NOTE — Progress Notes (Signed)
CC: Wt loss  Subjective: Patient is a 58 y.o. male here for wt loss. Due to COVID-19 pandemic, we are interacting via web portal for an electronic face-to-face visit. I verified patient's ID using 2 identifiers. Patient agreed to proceed with visit via this method. Patient is at home, I am at home. Patient and I are present for visit.   Over past 6 mo, pt has had unintentional wt loss. No change in diet, has actually eaten more. Stopped exercising due to concern for wt loss. He did have a scan around 6 mo ago showing renal lesion, rec'd 6 mo f/u. Thyroid studies neg. PSA in 2019 was nml. No diarrhea, N/V, pain, cough, new skin lesions. 4 yrs ago nml colonoscopy. Follows w heme for mild thrombocytopenia.  ROS: Const: +wt loss Lungs: Denies SOB   Past Medical History:  Diagnosis Date  . Abnormal EKG 03/20/2017  . Allergic rhinitis   . Carpal tunnel syndrome   . Central serous retinopathy   . CKD (chronic kidney disease), stage I   . Glaucoma   . Lumbar strain   . NSVT (nonsustained ventricular tachycardia) ([place])    Seen by Dr. Mina Marble (EP) recommended beta blocker therapy (2014)  . Proteinuria   . Sciatica of left side     Objective: No conversational dyspnea Age appropriate judgment and insight Nml affect and mood   Assessment and Plan: Unintentional weight loss of more than 10 pounds - Plan: Urine Microscopic Only, PSA, Tissue transglutaminase, IgA, Reticulin Antibody, IgA w reflex titer, will see if we can start scheduling process for MRI f/u. Monitor calorie intake.   The patient voiced understanding and agreement to the plan.  [place], DO 07/30/18  9:41 AM

## 2018-07-30 NOTE — Telephone Encounter (Signed)
Return as needed per 6/12 los

## 2018-08-02 ENCOUNTER — Other Ambulatory Visit (INDEPENDENT_AMBULATORY_CARE_PROVIDER_SITE_OTHER): Payer: Medicare Other

## 2018-08-02 ENCOUNTER — Other Ambulatory Visit: Payer: Self-pay

## 2018-08-02 DIAGNOSIS — R634 Abnormal weight loss: Secondary | ICD-10-CM

## 2018-08-02 LAB — URINALYSIS, MICROSCOPIC ONLY
RBC / HPF: NONE SEEN (ref 0–?)
WBC, UA: NONE SEEN (ref 0–?)

## 2018-08-02 LAB — PSA: PSA: 2.58 ng/mL (ref 0.10–4.00)

## 2018-08-03 ENCOUNTER — Telehealth: Payer: Self-pay

## 2018-08-03 NOTE — Telephone Encounter (Signed)
Still waiting on all results, so far normal. Ty.

## 2018-08-03 NOTE — Telephone Encounter (Signed)
Copied from [place] [id number]. Topic: General - Call Back - No Documentation >> Aug 03, 2018  8:49 AM Erick Blinks wrote: Reason for CRM: Requesting to discuss lab results. Please advise.  [phone]

## 2018-08-03 NOTE — Telephone Encounter (Signed)
Informed normal so far that is back and will call him when all completed.

## 2018-08-04 LAB — TISSUE TRANSGLUTAMINASE, IGA: (tTG) Ab, IgA: 1 U/mL

## 2018-08-04 LAB — RETICULIN ANTIBODIES, IGA W TITER: Reticulin IGA Screen: NEGATIVE

## 2018-08-06 ENCOUNTER — Other Ambulatory Visit: Payer: Self-pay | Admitting: Family Medicine

## 2018-08-06 DIAGNOSIS — R972 Elevated prostate specific antigen [PSA]: Secondary | ICD-10-CM

## 2018-08-10 ENCOUNTER — Telehealth: Payer: Self-pay | Admitting: Hematology

## 2018-08-10 NOTE — Telephone Encounter (Signed)
Called and LMVM for patient regarding date/time/location and instructions for MR @ [facility] 7/13 @ 9:00 w/ arrival @ 8:30 and NPO 4 hrs prior

## 2018-08-20 ENCOUNTER — Other Ambulatory Visit: Payer: Self-pay | Admitting: Family Medicine

## 2018-08-20 MED ORDER — METOPROLOL SUCCINATE ER 25 MG PO TB24: 25.0000 mg | ORAL_TABLET | Freq: Every day | ORAL | 2 refills | Status: DC

## 2018-08-22 DIAGNOSIS — R3912 Poor urinary stream: Secondary | ICD-10-CM | POA: Diagnosis not present

## 2018-08-27 ENCOUNTER — Other Ambulatory Visit: Payer: Self-pay

## 2018-08-27 ENCOUNTER — Telehealth: Payer: Self-pay | Admitting: *Deleted

## 2018-08-27 ENCOUNTER — Ambulatory Visit (HOSPITAL_COMMUNITY)
Admission: RE | Admit: 2018-08-27 | Discharge: 2018-08-27 | Disposition: A | Discharge status: Home / Self Care | Payer: Medicare Other | Source: Ambulatory Visit | Attending: Hematology | Admitting: Hematology

## 2018-08-27 DIAGNOSIS — N281 Cyst of kidney, acquired: Secondary | ICD-10-CM | POA: Insufficient documentation

## 2018-08-27 DIAGNOSIS — N2889 Other specified disorders of kidney and ureter: Secondary | ICD-10-CM

## 2018-08-27 MED ORDER — GADOBUTROL 1 MMOL/ML IV SOLN
9.0000 mL | Freq: Once | INTRAVENOUS | Status: AC | PRN
  Administered 2018-08-27: 9 mL via INTRAVENOUS

## 2018-08-27 NOTE — Telephone Encounter (Signed)
As noted below by Dr. Maylon Peppers, I informed the patient that his MRI of abdomen showed a stable kidney cyst. There is no evidence of cancer.

## 2018-08-27 NOTE — Telephone Encounter (Signed)
-----   Message from Tish Men, MD sent at 08/27/2018 11:38 AM EDT ----- Delrae Sawyers,  Can you let Mr. Justin Frazier know that his MRI abdomen showed the stable kidney cyst? No evidence of cancer.  Thanks.  [place]  ----- Message ----- From: Interface, Rad Results In Sent: 08/27/2018  11:35 AM EDT To: Tish Men, MD

## 2018-08-31 ENCOUNTER — Encounter: Payer: Self-pay | Admitting: Family Medicine

## 2018-08-31 ENCOUNTER — Ambulatory Visit (INDEPENDENT_AMBULATORY_CARE_PROVIDER_SITE_OTHER): Payer: Medicare Other | Admitting: Family Medicine

## 2018-08-31 ENCOUNTER — Other Ambulatory Visit: Payer: Self-pay

## 2018-08-31 VITALS — BP 130/86 | HR 63 | Temp 98.6°F | Ht 75.0 in | Wt 177.4 lb

## 2018-08-31 DIAGNOSIS — L720 Epidermal cyst: Secondary | ICD-10-CM

## 2018-08-31 DIAGNOSIS — L989 Disorder of the skin and subcutaneous tissue, unspecified: Secondary | ICD-10-CM | POA: Diagnosis not present

## 2018-08-31 DIAGNOSIS — R634 Abnormal weight loss: Secondary | ICD-10-CM

## 2018-08-31 HISTORY — DX: Abnormal weight loss: R63.4

## 2018-08-31 MED ORDER — TRIAMCINOLONE ACETONIDE 0.1 % EX CREA: 1.0000 "application " | TOPICAL_CREAM | Freq: Two times a day (BID) | CUTANEOUS | 0 refills | Status: DC

## 2018-08-31 NOTE — Patient Instructions (Signed)
If you do not hear anything about your referral in the next 1-2 weeks, call our office and ask for an update.  You don't need to do anything about the neck.  Your thigh does not look worrisome. If you ever want to see a skin doctor, let me know. We can take it off and send it to a lab if it doesn't get better also.  Let us know if you need anything.

## 2018-08-31 NOTE — Progress Notes (Signed)
Chief Complaint  Patient presents with  . spot on left inner thigh    Justin Frazier is a 58 y.o. male here for a skin complaint.  Duration: 2 months Location: Inner L thigh Pruritic? Yes Painful? No Drainage? No New soaps/lotions/topicals/detergents? No Sick contacts? No Other associated symptoms: started as ingrown hair Therapies tried thus far: TAO  Also has noticed a spot on the back of his neck for the past couple weeks. Dark and smelly material comes out of it. No fevers or pain. No new topicals, has not tried anything thus far.  Hx of unintentional wt loss. Has been evaluated by Heme/Onc, just had nml scan. Lab eval neg thus far. Has stopped working out and has increased his PO intake.   ROS:  Const: No fevers Skin: As noted in HPI  Past Medical History:  Diagnosis Date  . Abnormal EKG 03/20/2017  . Allergic rhinitis   . Carpal tunnel syndrome   . Central serous retinopathy   . CKD (chronic kidney disease), stage I   . Glaucoma   . Lumbar strain   . NSVT (nonsustained ventricular tachycardia) ([place])    Seen by Dr. Mina Marble (EP) recommended beta blocker therapy (2014)  . Proteinuria   . Sciatica of left side     BP 130/86 (BP Location: Left Arm, Patient Position: Sitting, Cuff Size: Normal)   Pulse 63   Temp 98.6 F (37 C) (Oral)   Ht 6\' 3"  (1.905 m)   Wt 177 lb 6 oz (80.5 kg)   SpO2 99%   BMI 22.17 kg/m  Gen: awake, alert, appearing stated age Lungs: No accessory muscle use Skin: circular and largely indurated lesion with opening on back of neck on R; no ttp, erythema, excessive warmth.  Inner thigh- there is a raised and slightly hyperpigmented lesion measuring 0.7 cm in diameter without erythema, fluctuance, drainage, excessive warmth, ttp, or scaling Psych: Age appropriate judgment and insight  Unintentional weight loss of more than 10 pounds - Plan: Ambulatory referral to Endocrinology, counseled on diet and exercise. If this referral not fruitful, will  consider dietician.   Epidermal cyst of neck - Plan: watchful waiting.   Skin lesion - Plan: trial topical steroid. If no improvement, will biopsy or refer to derm.   Orders as above. F/u prn. The patient voiced understanding and agreement to the plan.  [place], DO 08/31/18 3:40 PM

## 2018-09-06 ENCOUNTER — Ambulatory Visit: Payer: Medicare Other | Admitting: Hematology

## 2018-09-06 ENCOUNTER — Other Ambulatory Visit: Payer: Medicare Other

## 2018-10-10 ENCOUNTER — Ambulatory Visit (INDEPENDENT_AMBULATORY_CARE_PROVIDER_SITE_OTHER): Payer: Medicare Other | Admitting: Endocrinology

## 2018-10-10 ENCOUNTER — Encounter: Payer: Self-pay | Admitting: Endocrinology

## 2018-10-10 ENCOUNTER — Other Ambulatory Visit: Payer: Self-pay

## 2018-10-10 VITALS — BP 122/70 | HR 50 | Ht 75.0 in | Wt 182.8 lb

## 2018-10-10 DIAGNOSIS — R634 Abnormal weight loss: Secondary | ICD-10-CM | POA: Diagnosis not present

## 2018-10-10 LAB — CORTISOL: Cortisol, Plasma: 8.3 ug/dL

## 2018-10-10 NOTE — Progress Notes (Signed)
Subjective:    Patient ID: Justin Frazier, male    DOB: [birth year], 58 y.o.   MRN: [id number]  HPI Pt states 1 year of unintentional slight to moderate weight loss (15 lbs).  He has assoc tremulousness.  Weight has leveled off x 3 weeks.  He feels as though his appetite is good.   Past Medical History:  Diagnosis Date  . Abnormal EKG 03/20/2017  . Allergic rhinitis   . Carpal tunnel syndrome   . Central serous retinopathy   . CKD (chronic kidney disease), stage I   . Glaucoma   . Lumbar strain   . NSVT (nonsustained ventricular tachycardia) ([place])    Seen by Dr. Mina Marble (EP) recommended beta blocker therapy (2014)  . Proteinuria   . Sciatica of left side     Past Surgical History:  Procedure Laterality Date  . COLONOSCOPY  06/11/2013    Social History   Socioeconomic History  . Marital status: Married    Spouse name: Not on file  . Number of children: Not on file  . Years of education: Not on file  . Highest education level: Not on file  Occupational History  . Not on file  Social Needs  . Financial resource strain: Not on file  . Food insecurity    Worry: Not on file    Inability: Not on file  . Transportation needs    Medical: Not on file    Non-medical: Not on file  Tobacco Use  . Smoking status: Never Smoker  . Smokeless tobacco: Never Used  Substance and Sexual Activity  . Alcohol use: No    Frequency: Never  . Drug use: Yes  . Sexual activity: Not Currently  Lifestyle  . Physical activity    Days per week: Not on file    Minutes per session: Not on file  . Stress: Not on file  Relationships  . Social Herbalist on phone: Not on file    Gets together: Not on file    Attends religious service: Not on file    Active member of club or organization: Not on file    Attends meetings of clubs or organizations: Not on file    Relationship status: Not on file  . Intimate partner violence    Fear of current or ex partner: Not on file   Emotionally abused: Not on file    Physically abused: Not on file    Forced sexual activity: Not on file  Other Topics Concern  . Not on file  Social History Narrative  . Not on file    Current Outpatient Medications on File Prior to Visit  Medication Sig Dispense Refill  . albuterol (PROVENTIL HFA;VENTOLIN HFA) 108 (90 Base) MCG/ACT inhaler Inhale 2 puffs into the lungs every 6 (six) hours as needed. Every 4-6 hours as needed    . aspirin EC 81 MG tablet Take 81 mg by mouth daily.     . Cholecalciferol 5000 units TABS Take 5,000 mg by mouth daily.    . dorzolamide-timolol (COSOPT) 22.3-6.8 MG/ML ophthalmic solution Place 1 drop into both eyes 2 (two) times daily.    . Fluticasone Furoate (ARNUITY ELLIPTA) 50 MCG/ACT AEPB Inhale 1 puff into the lungs 2 (two) times daily. (Patient taking differently: Inhale 1 puff into the lungs 2 (two) times daily as needed. ) 1 each 0  . lisinopril (ZESTRIL) 10 MG tablet Take 10 mg by mouth daily.     Marland Kitchen  metoprolol succinate (TOPROL-XL) 25 MG 24 hr tablet Take 1 tablet (25 mg total) by mouth daily. 90 tablet 2  . montelukast (SINGULAIR) 10 MG tablet Take 10 mg by mouth daily.     Marland Kitchen omeprazole (PRILOSEC) 20 MG capsule TAKE 1 CAPSULE BY MOUTH EVERY DAY 90 capsule 1  . traMADol (ULTRAM) 50 MG tablet Take 2 tabs every 6 hours as needed for moderate-severe pain. Not to exceed 4 tabs daily. 30 tablet 3  . travoprost, benzalkonium, (TRAVATAN) 0.004 % ophthalmic solution Place 1 drop into both eyes at bedtime.    . vitamin B-12 (CYANOCOBALAMIN) 1000 MCG tablet Take 1,000 mcg by mouth daily.     No current facility-administered medications on file prior to visit.     No Known Allergies  Family History  Problem Relation Age of Onset  . Alzheimer's disease Mother   . Hypertension Father   . Liver cancer Father     BP 122/70 (BP Location: Left Arm, Patient Position: Sitting, Cuff Size: Normal)   Pulse (!) 50   Ht 6\' 3"  (1.905 m)   Wt 182 lb 12.8 oz (82.9  kg)   SpO2 98%   BMI 22.85 kg/m   Review of Systems  Constitutional: Negative for chills, diaphoresis, fatigue and fever.  HENT: Negative for trouble swallowing.   Eyes: Negative for visual disturbance.  Respiratory: Negative for shortness of breath.   Cardiovascular: Negative for palpitations.  Gastrointestinal: Negative for nausea and vomiting.  Endocrine: Negative for heat intolerance and polydipsia.  Genitourinary: Negative for hematuria.  Musculoskeletal: Negative for arthralgias.  Skin: Negative for rash.  Neurological: Negative for tremors, numbness and headaches.  Psychiatric/Behavioral: Negative for dysphoric mood.       Objective:   Physical Exam VS: see vs page GEN: no distress HEAD: head: no deformity eyes: no periorbital swelling, no proptosis external nose and ears are normal NECK: supple, thyroid is not enlarged CHEST WALL: no deformity LUNGS: clear to auscultation CV: reg rate and rhythm, no murmur ABD: abdomen is soft, nontender.  no hepatosplenomegaly.  not distended.  no hernia MUSCULOSKELETAL: muscle bulk and strength are grossly normal.  no obvious joint swelling.  gait is normal and steady EXTEMITIES: no deformity.  no ulcer on the feet.  feet are of normal color and temp.  no edema PULSES: dorsalis pedis intact bilat.  no carotid bruit.  NEURO:  cn 2-12 grossly intact.   readily moves all 4's.  sensation is intact to touch on the feet. No tremor SKIN:  Normal texture and temperature.  No rash or suspicious lesion is visible.  Not diaphoretic  NODES:  None palpable at the neck PSYCH: alert, well-oriented.  Does not appear anxious nor depressed.    MRI: adrenals are normal.  Only abnormality is renal cyst.    Lab Results  Component Value Date   CALCIUM 9.8 07/27/2018   I have reviewed outside records, and summarized: Pt was noted to have weight loss, and referred here.  He also had extensive nonendocrine w/u.      Assessment & Plan:  Weight  loss, new to me.  No clinical evidence of neuroendocrine hyperfunction.    Patient Instructions  A blood test is requested for you today.  We'll let you know about the results.  I would be happy to see you back here as needed

## 2018-10-10 NOTE — Patient Instructions (Signed)
A blood test is requested for you today.  We'll let you know about the results.  I would be happy to see you back here as needed

## 2018-11-30 ENCOUNTER — Other Ambulatory Visit: Payer: Self-pay | Admitting: Family Medicine

## 2018-12-20 ENCOUNTER — Other Ambulatory Visit: Payer: Self-pay

## 2018-12-21 ENCOUNTER — Ambulatory Visit (INDEPENDENT_AMBULATORY_CARE_PROVIDER_SITE_OTHER): Payer: Medicare Other | Admitting: Family Medicine

## 2018-12-21 ENCOUNTER — Encounter: Payer: Self-pay | Admitting: Family Medicine

## 2018-12-21 VITALS — BP 132/88 | HR 60 | Temp 97.4°F | Ht 75.0 in | Wt 186.0 lb

## 2018-12-21 DIAGNOSIS — R599 Enlarged lymph nodes, unspecified: Secondary | ICD-10-CM

## 2018-12-21 DIAGNOSIS — M7522 Bicipital tendinitis, left shoulder: Secondary | ICD-10-CM

## 2018-12-21 NOTE — Patient Instructions (Addendum)
I will reassure you about your lymph node. This should resolve on it's own over the next month or so. If it gets bigger or starts to hurt, let me know.  Ice/cold pack over area for 10-15 min twice daily.  OK to take Tylenol 1000 mg (2 extra strength tabs) or 975 mg (3 regular strength tabs) every 6 hours as needed.  See if you can find records on your tetanus shot.   Biceps Tendon Disruption (Proximal) Rehab Do exercises exactly as told by your health care provider and adjust them as directed. It is normal to feel mild stretching, pulling, tightness, or discomfort as you do these exercises, but you should stop right away if you feel sudden pain or your pain gets worse. Stretching and range of motion exercises These exercises warm up your muscles and joints and improve the movement and flexibility of your arm and shoulder. These exercises also help to relieve pain and stiffness. Exercise A: Shoulder flexion, standing   1. Stand facing a wall. Put your left / right hand on the wall. 2. Slide your left / right hand up the wall. Stop when you feel a stretch in your shoulder, or when you reach the angle recommended by your health care provider.  Use your other hand to help raise your arm, if needed.  As your hand gets higher, you may need to step closer to the wall.  Avoid shrugging your shoulder while you raise your arm. To do this, keep your shoulder blade tucked down toward your spine. 3. Hold for 30 seconds. 4. Slowly return to the starting position. Use your other arm to help, if needed. Repeat 2 times. Complete this exercise 3 times per week. Exercise B: Pendulum   1. Stand near a wall or a surface that you can hold onto for balance. 2. Bend at the waist and let your left / right arm hang straight down. Use your other arm to support you. 3. Relax your arm and shoulder muscles, and move your hips and your trunk so your left / right arm swings freely. Your arm should swing because of the  motion of your body, not because you are using your arm or shoulder muscles. 4. Keep moving so your arm swings in the following directions, as told by your health care provider:  Side to side.  Forward and backward.  In clockwise and counterclockwise circles. 5. Slowly return to the starting position. Repeat 2 times. Complete this exercise 3 times per week.  Strengthening exercises These exercises build strength and endurance in your arm and shoulder. Endurance is the ability to use your muscles for a long time, even after your muscles get tired. Exercise C: Elbow flexion, neutral  1. Sit on a stable chair without armrests, or stand. 2. Hold a 3-5 lb weight in your left / right hand, or hold an exercise band with both hands. Your palms should face each other at the starting position. 3. Bend your left / right elbow and move your hand up toward your shoulder.  Lead with your thumb, and keep your palm facing the same direction.  Keep your other arm straight down, in the starting position. 4. Slowly return to the starting position. Repeat 2-3 times. Complete this exercise 3 times per week. Exercise D: Forearm supination   1. Sit with your left / right forearm on a table. Your elbow should be below shoulder height. Rest your hand over the edge of the table so your palm faces  down. 2. If directed, hold a hammer with your left / right hand. 3. Without moving your elbow, slowly rotate your hand so your palm faces up toward the ceiling.  If you are holding a hammer, begin by holding the hammer near the head. When this exercise gets easier for you, hold the hammer farther down the handle. 4. Hold for 3 seconds. 5. Slowly return to the starting position. Repeat 2 times. Complete this exercise 3 times per week. Exercise E: Scapular retraction   1. Sit in a stable chair without armrests, or stand. 2. Secure an exercise band to a stable object in front of you so the band is at shoulder height.  3. Hold one end of the exercise band in each hand. 4. Squeeze your shoulder blades together and move your elbows slightly behind you. Do not shrug your shoulders. 5. Hold for 3 seconds. 6. Slowly return to the starting position. Repeat 2 times. Complete this exercise 3 times per week. Exercise F: Scapular protraction, supine   1. Lie on your back on a firm surface. Hold a 3-5 lb weight in your left / right hand. 2. Raise your left / right arm straight into the air so your hand is directly above your shoulder joint. 3. Push the weight into the air so your shoulder lifts off of the surface that you are lying on. Do not move your head, neck, or back. 4. Hold for 3 seconds. 5. Slowly return to the starting position. Let your muscles relax completely before you repeat this exercise. Repeat 2 times. Complete this exercise 3 times per week. This information is not intended to replace advice given to you by your health care provider. Make sure you discuss any questions you have with your health care provider. Document Released: 01/31/2005 Document Revised: 10/08/2015 Document Reviewed: 01/09/2015 Elsevier Interactive Patient Education  2017 [employer].

## 2018-12-21 NOTE — Progress Notes (Signed)
Chief Complaint  Patient presents with  . Follow-up    jaw  . Shoulder Pain    left    BERWYN RIM is a 58 y.o. male here for a skin complaint.  Duration: 2 days Location: R jaw Pruritic? No Painful? No Drainage? No No recent illnesses Other associated symptoms: no fevers or injuries Therapies tried thus far: none  L shoulder pain Going on for around 1 week. No inj or change in activity. Decreased ROM. No bruising, swelling, or redness. Feels achy, getting better. Therapies tried increased rest, some sort of topical tx.   ROS:  Const: No fevers Skin: As noted in HPI MSK: +L shoulder pain  Past Medical History:  Diagnosis Date  . Abnormal EKG 03/20/2017  . Allergic rhinitis   . Carpal tunnel syndrome   . Central serous retinopathy   . CKD (chronic kidney disease), stage I   . Glaucoma   . Lumbar strain   . NSVT (nonsustained ventricular tachycardia) ([place])    Seen by Dr. Mina Marble (EP) recommended beta blocker therapy (2014)  . Proteinuria   . Sciatica of left side     BP 132/88 (BP Location: Left Arm, Patient Position: Sitting, Cuff Size: Normal)   Pulse 60   Temp (!) 97.4 F (36.3 C) (Temporal)   Ht 6\' 3"  (1.905 m)   Wt 186 lb (84.4 kg)   SpO2 97%   BMI 23.25 kg/m  Gen: awake, alert, appearing stated age Lungs: No accessory muscle use Skin: R TMJ area, 1.5 x 1.3 cm ellipitically shaped lesion, freely moveable w smooth borders. No drainage, erythema, TTP, fluctuance, excoriation MSK: L shoulder- decreased active ROM. No TTP.  Psych: Age appropriate judgment and insight   Enlarged lymph node  Biceps tendinitis of left upper extremity  1- reassurance. 2-ice, heat, stretches/exercises. F/u prn. The patient voiced understanding and agreement to the plan.  [place], DO 12/21/18 3:56 PM

## 2019-01-15 ENCOUNTER — Other Ambulatory Visit: Payer: Self-pay

## 2019-01-16 ENCOUNTER — Other Ambulatory Visit: Payer: Self-pay

## 2019-01-16 ENCOUNTER — Ambulatory Visit (INDEPENDENT_AMBULATORY_CARE_PROVIDER_SITE_OTHER): Payer: Medicare Other | Admitting: Family Medicine

## 2019-01-16 ENCOUNTER — Encounter: Payer: Self-pay | Admitting: Family Medicine

## 2019-01-16 VITALS — BP 130/78 | HR 65 | Temp 97.2°F | Ht 75.0 in | Wt 191.4 lb

## 2019-01-16 DIAGNOSIS — M25512 Pain in left shoulder: Secondary | ICD-10-CM

## 2019-01-16 DIAGNOSIS — R599 Enlarged lymph nodes, unspecified: Secondary | ICD-10-CM

## 2019-01-16 DIAGNOSIS — Z125 Encounter for screening for malignant neoplasm of prostate: Secondary | ICD-10-CM

## 2019-01-16 DIAGNOSIS — I889 Nonspecific lymphadenitis, unspecified: Secondary | ICD-10-CM | POA: Diagnosis not present

## 2019-01-16 DIAGNOSIS — Z0000 Encounter for general adult medical examination without abnormal findings: Secondary | ICD-10-CM

## 2019-01-16 HISTORY — DX: Enlarged lymph nodes, unspecified: R59.9

## 2019-01-16 LAB — CBC
HCT: 43.8 % (ref 39.0–52.0)
Hemoglobin: 14.6 g/dL (ref 13.0–17.0)
MCHC: 33.3 g/dL (ref 30.0–36.0)
MCV: 94.5 fl (ref 78.0–100.0)
Platelets: 126 10*3/uL — ABNORMAL LOW (ref 150.0–400.0)
RBC: 4.63 Mil/uL (ref 4.22–5.81)
RDW: 13.8 % (ref 11.5–15.5)
WBC: 4.2 10*3/uL (ref 4.0–10.5)

## 2019-01-16 LAB — LIPID PANEL
Cholesterol: 185 mg/dL (ref 0–200)
HDL: 50.2 mg/dL (ref >39.00)
LDL Cholesterol: 121 mg/dL — ABNORMAL HIGH (ref 0–99)
NonHDL: 134.55
Total CHOL/HDL Ratio: 4
Triglycerides: 69 mg/dL (ref 0.0–149.0)
VLDL: 13.8 mg/dL (ref 0.0–40.0)

## 2019-01-16 LAB — COMPREHENSIVE METABOLIC PANEL
ALT: 10 U/L (ref 0–53)
AST: 15 U/L (ref 0–37)
Albumin: 4.2 g/dL (ref 3.5–5.2)
Alkaline Phosphatase: 63 U/L (ref 39–117)
BUN: 12 mg/dL (ref 6–23)
CO2: 28 mEq/L (ref 19–32)
Calcium: 9.2 mg/dL (ref 8.4–10.5)
Chloride: 105 mEq/L (ref 96–112)
Creatinine, Ser: 1.16 mg/dL (ref 0.40–1.50)
GFR: 78.08 mL/min (ref >60.00)
Glucose, Bld: 84 mg/dL (ref 70–99)
Potassium: 3.9 mEq/L (ref 3.5–5.1)
Sodium: 141 mEq/L (ref 135–145)
Total Bilirubin: 1 mg/dL (ref 0.2–1.2)
Total Protein: 6.7 g/dL (ref 6.0–8.3)

## 2019-01-16 LAB — PSA: PSA: 3.04 ng/mL (ref 0.10–4.00)

## 2019-01-16 MED ORDER — MONTELUKAST SODIUM 10 MG PO TABS: 10.0000 mg | ORAL_TABLET | Freq: Every day | ORAL | 2 refills | Status: DC

## 2019-01-16 MED ORDER — ALBUTEROL SULFATE HFA 108 (90 BASE) MCG/ACT IN AERS: 1.0000 | INHALATION_SPRAY | Freq: Four times a day (QID) | RESPIRATORY_TRACT | 2 refills | Status: DC | PRN

## 2019-01-16 MED ORDER — TRAMADOL HCL 50 MG PO TABS: ORAL_TABLET | ORAL | 3 refills | Status: DC

## 2019-01-16 MED ORDER — LISINOPRIL 10 MG PO TABS: 10.0000 mg | ORAL_TABLET | Freq: Every day | ORAL | 2 refills | Status: DC

## 2019-01-16 NOTE — Progress Notes (Signed)
Chief Complaint  Patient presents with  . Annual Exam    Well Male Justin Frazier is here for a complete physical.   His last physical was >1 year ago.  Current diet: in general, eats too much, pretty health Current exercise: active in the yard Weight trend: stable Daytime fatigue? No. Seat belt? Yes.    The lymph node on the patient's right jaw still present.  It is not painful or draining.  There is no skin change over the lesion.  There are no size changes.  He would like it evaluated further though.  Denies unexplained weight loss, jaw pain, cough/shortness of breath.  His left shoulder pain continues.  He has been doing stretches and exercises that have helped pain and range of motion.  He reports around 50% improvement.  Curious about other options that he has.  Health maintenance Shingrix- No Colonoscopy- Yes Tetanus- unsure HIV- Yes Hep C- Yes Prostate cancer screening- Yes   Past Medical History:  Diagnosis Date  . Abnormal EKG 03/20/2017  . Allergic rhinitis   . Carpal tunnel syndrome   . Central serous retinopathy   . CKD (chronic kidney disease), stage I   . Glaucoma   . Lumbar strain   . NSVT (nonsustained ventricular tachycardia) ([place])    Seen by Dr. Mina Marble (EP) recommended beta blocker therapy (2014)  . Proteinuria   . Sciatica of left side       Past Surgical History:  Procedure Laterality Date  . COLONOSCOPY  06/11/2013    Medications  Current Outpatient Medications on File Prior to Visit  Medication Sig Dispense Refill  . albuterol (PROVENTIL HFA;VENTOLIN HFA) 108 (90 Base) MCG/ACT inhaler Inhale 2 puffs into the lungs every 6 (six) hours as needed. Every 4-6 hours as needed    . aspirin EC 81 MG tablet Take 81 mg by mouth daily.     . Cholecalciferol 5000 units TABS Take 5,000 mg by mouth daily.    . dorzolamide-timolol (COSOPT) 22.3-6.8 MG/ML ophthalmic solution Place 1 drop into both eyes 2 (two) times daily.    . Fluticasone Furoate (ARNUITY  ELLIPTA) 50 MCG/ACT AEPB Inhale 1 puff into the lungs 2 (two) times daily. (Patient taking differently: Inhale 1 puff into the lungs 2 (two) times daily as needed. ) 1 each 0  . lisinopril (ZESTRIL) 10 MG tablet TAKE 1 TABLET BY MOUTH EVERY DAY 90 tablet 0  . metoprolol succinate (TOPROL-XL) 25 MG 24 hr tablet Take 1 tablet (25 mg total) by mouth daily. 90 tablet 2  . montelukast (SINGULAIR) 10 MG tablet Take 10 mg by mouth daily.     Marland Kitchen omeprazole (PRILOSEC) 20 MG capsule TAKE 1 CAPSULE BY MOUTH EVERY DAY 90 capsule 1  . traMADol (ULTRAM) 50 MG tablet Take 2 tabs every 6 hours as needed for moderate-severe pain. Not to exceed 4 tabs daily. 30 tablet 3  . travoprost, benzalkonium, (TRAVATAN) 0.004 % ophthalmic solution Place 1 drop into both eyes at bedtime.    . vitamin B-12 (CYANOCOBALAMIN) 1000 MCG tablet Take 1,000 mcg by mouth daily.     Allergies No Known Allergies  Family History Family History  Problem Relation Age of Onset  . Alzheimer's disease Mother   . Hypertension Father   . Liver cancer Father     Review of Systems: Constitutional:  no fevers Eye:  no recent significant change in vision Ear/Nose/Mouth/Throat:  Ears:  no hearing loss Nose/Mouth/Throat:  no complaints of nasal congestion, no sore  throat Cardiovascular:  no chest pain, no palpitations Respiratory:  no cough and no shortness of breath Gastrointestinal:  no abdominal pain, no change in bowel habits GU:  Male: negative for dysuria, frequency, and incontinence and negative for prostate symptoms Musculoskeletal/Extremities: +shoulder pain; otherwise no pain, redness, or swelling of the joints Integumentary (Skin/Breast): +lump over jaw; otherwise no abnormal skin lesions reported Neurologic:  no headaches Endocrine: No unexpected weight changes  Exam BP 130/78 (BP Location: Left Arm, Patient Position: Sitting, Cuff Size: Normal)   Pulse 65   Temp (!) 97.2 F (36.2 C) (Temporal)   Ht '6\' 3"'  (1.905 m)   Wt  191 lb 6 oz (86.8 kg)   SpO2 96%   BMI 23.92 kg/m  General:  well developed, well nourished, in no apparent distress Skin: There is a circular raised lesion that is freely movable over the right TMJ that is not tender to palpation and without fluctuance or excessive warmth, it measures 1.2 cm in diameter; otherwise no significant moles, warts, or growths Head:  no masses, lesions, or tenderness Eyes:  pupils equal and round, sclera anicteric without injection Ears:  canals without lesions, TMs shiny without retraction, no obvious effusion, no erythema Nose:  nares patent, septum midline, mucosa normal Throat/Pharynx:  lips and gingiva without lesion; tongue and uvula midline; non-inflamed pharynx; no exudates or postnasal drainage Neck: neck supple without adenopathy, thyromegaly, or masses Cardiac: RRR, no bruits, no LE edema Lungs:  clear to auscultation, breath sounds equal bilaterally, no respiratory distress Rectal: Deferred Musculoskeletal: L shoulder-decreased active and passive forward flexion, no tenderness to palpation, positive Neer's and empty can, negative Hawkins, crossover, speeds, liftoff; symmetrical muscle groups noted without atrophy or deformity Neuro:  gait normal; deep tendon reflexes normal and symmetric Psych: well oriented with normal range of affect and appropriate judgment/insight  Assessment and Plan  Well adult exam - Plan: CBC, Comp Met (CMET), Lipid Profile  Screening for prostate cancer - Plan: PSA  Enlarged lymph node  Nonspecific lymphadenitis - Plan: CT Soft Tissue Neck W Contrast  Left shoulder pain, unspecified chronicity   Well 58 y.o. male. Counseled on diet and exercise. Counseled on risks and benefits of prostate cancer screening with PSA. The patient agrees to undergo testing. Immunizations, labs, and further orders as above. Follow up in 6 months or as needed. The patient voiced understanding and agreement to the plan.  [place], DO 01/16/19 10:13 AM

## 2019-01-16 NOTE — Patient Instructions (Addendum)
Give [place] 2-3 business days to get the results of your labs back.   Keep the diet clean and stay active.  The new Shingrix vaccine (for shingles) is a 2 shot series. It can make people feel low energy, achy and almost like they have the flu for 48 hours after injection. Please plan accordingly when deciding on when to get this shot. Call our office for a nurse visit appointment to get this. The second shot of the series is less severe regarding the side effects, but it still lasts 48 hours.   Find out when your last tetanus shot was.  Let us know if you need anything.

## 2019-01-17 ENCOUNTER — Telehealth: Payer: Self-pay | Admitting: Family Medicine

## 2019-01-17 DIAGNOSIS — Z125 Encounter for screening for malignant neoplasm of prostate: Secondary | ICD-10-CM

## 2019-01-17 NOTE — Telephone Encounter (Signed)
Pt called in to make provider aware that he would like to go ahead with referral to urology.   Please assist pt.

## 2019-01-18 NOTE — Telephone Encounter (Signed)
Referral placed for patient.  

## 2019-01-21 ENCOUNTER — Ambulatory Visit (HOSPITAL_BASED_OUTPATIENT_CLINIC_OR_DEPARTMENT_OTHER): Payer: Medicare Other

## 2019-01-22 ENCOUNTER — Other Ambulatory Visit: Payer: Self-pay

## 2019-01-22 ENCOUNTER — Other Ambulatory Visit: Payer: Self-pay | Admitting: Family Medicine

## 2019-01-22 ENCOUNTER — Ambulatory Visit (HOSPITAL_BASED_OUTPATIENT_CLINIC_OR_DEPARTMENT_OTHER)
Admission: RE | Admit: 2019-01-22 | Discharge: 2019-01-22 | Disposition: A | Discharge status: Home / Self Care | Payer: Medicare Other | Source: Ambulatory Visit | Attending: Family Medicine | Admitting: Family Medicine

## 2019-01-22 DIAGNOSIS — K118 Other diseases of salivary glands: Secondary | ICD-10-CM | POA: Diagnosis not present

## 2019-01-22 DIAGNOSIS — M278 Other specified diseases of jaws: Secondary | ICD-10-CM

## 2019-01-22 DIAGNOSIS — I889 Nonspecific lymphadenitis, unspecified: Secondary | ICD-10-CM

## 2019-01-22 MED ORDER — IOHEXOL 300 MG/ML  SOLN
100.0000 mL | Freq: Once | INTRAMUSCULAR | Status: AC | PRN
  Administered 2019-01-22: 100 mL via INTRAVENOUS

## 2019-02-25 DIAGNOSIS — Z87891 Personal history of nicotine dependence: Secondary | ICD-10-CM | POA: Diagnosis not present

## 2019-02-25 DIAGNOSIS — D110 Benign neoplasm of parotid gland: Secondary | ICD-10-CM | POA: Diagnosis not present

## 2019-02-25 DIAGNOSIS — D490 Neoplasm of unspecified behavior of digestive system: Secondary | ICD-10-CM

## 2019-02-25 DIAGNOSIS — K118 Other diseases of salivary glands: Secondary | ICD-10-CM | POA: Diagnosis not present

## 2019-02-25 HISTORY — DX: Neoplasm of unspecified behavior of digestive system: D49.0

## 2019-02-28 DIAGNOSIS — R3912 Poor urinary stream: Secondary | ICD-10-CM | POA: Diagnosis not present

## 2019-04-22 ENCOUNTER — Other Ambulatory Visit: Payer: Self-pay

## 2019-04-22 ENCOUNTER — Encounter: Payer: Self-pay | Admitting: Cardiology

## 2019-04-22 ENCOUNTER — Ambulatory Visit (INDEPENDENT_AMBULATORY_CARE_PROVIDER_SITE_OTHER): Payer: Medicare Other | Admitting: Cardiology

## 2019-04-22 VITALS — BP 138/78 | HR 72 | Ht 75.0 in | Wt 194.0 lb

## 2019-04-22 DIAGNOSIS — I422 Other hypertrophic cardiomyopathy: Secondary | ICD-10-CM | POA: Diagnosis not present

## 2019-04-22 NOTE — Progress Notes (Signed)
Electrophysiology Office Note   Date:  04/22/2019   ID:  [name], DOB 02/11/1961, MRN [id number]  PCP:  [name], DO  Cardiologist:  Revankar Primary Electrophysiologist:  Syd Manges Meredith Leeds, MD    No chief complaint on file.    History of Present Illness: Justin Frazier is a 59 y.o. male who is being seen today for the evaluation of hypertrophic cardia myopathy at the request of [name]. Presenting today for electrophysiology evaluation.  He has a history of CKD stage I, hypertension, LVH, and nonsustained VT on beta-blockers.  He has occasional palpitations on and off for the last several years.  He stopped caffeine and his palpitations went away.  He has not had any episodes of syncope, near syncope.  He does not have a family history of sudden death.  Wore a cardiac monitor that did not show sustained arrhythmias.  He does not have an LV apical aneurysm.  He does have a daughter.  Today, denies symptoms of palpitations, chest pain, shortness of breath, orthopnea, PND, lower extremity edema, claudication, dizziness, presyncope, syncope, bleeding, or neurologic sequela. The patient is tolerating medications without difficulties.  Overall he is doing well.  He has had no chest pain or shortness of breath.  He has no palpitations.  He had an insurance physical and was found to have an elevated BNP around 350.  He is coming in concerned about number.  He has not been gaining weight.  He has not been short of breath with exertion.  He has no lower extremity edema.  No signs or symptoms of volume overload.  Past Medical History:  Diagnosis Date  . Abnormal EKG 03/20/2017  . Allergic rhinitis   . Carpal tunnel syndrome   . Central serous retinopathy   . CKD (chronic kidney disease), stage I   . Glaucoma   . Lumbar strain   . NSVT (nonsustained ventricular tachycardia) ([place])    Seen by Dr. Mina Marble (EP) recommended beta blocker therapy (2014)  . Proteinuria    . Sciatica of left side    Past Surgical History:  Procedure Laterality Date  . COLONOSCOPY  06/11/2013     Current Outpatient Medications  Medication Sig Dispense Refill  . albuterol (VENTOLIN HFA) 108 (90 Base) MCG/ACT inhaler Inhale 1-2 puffs into the lungs every 6 (six) hours as needed. Every 4-6 hours as needed 18 g 2  . aspirin EC 81 MG tablet Take 81 mg by mouth daily.     . Cholecalciferol 5000 units TABS Take 5,000 mg by mouth daily.    . dorzolamide-timolol (COSOPT) 22.3-6.8 MG/ML ophthalmic solution Place 1 drop into both eyes 2 (two) times daily.    Marland Kitchen lisinopril (ZESTRIL) 10 MG tablet Take 1 tablet (10 mg total) by mouth daily. 90 tablet 2  . metoprolol succinate (TOPROL-XL) 25 MG 24 hr tablet Take 1 tablet (25 mg total) by mouth daily. 90 tablet 2  . montelukast (SINGULAIR) 10 MG tablet Take 1 tablet (10 mg total) by mouth daily. 90 tablet 2  . traMADol (ULTRAM) 50 MG tablet Take 2 tabs every 6 hours as needed for moderate-severe pain. Not to exceed 4 tabs daily. 30 tablet 3  . travoprost, benzalkonium, (TRAVATAN) 0.004 % ophthalmic solution Place 1 drop into both eyes at bedtime.    . vitamin B-12 (CYANOCOBALAMIN) 1000 MCG tablet Take 1,000 mcg by mouth daily.     No current facility-administered medications for this visit.    Allergies:  Patient has no known allergies.   Social History:  The patient  reports that he has never smoked. He has never used smokeless tobacco. He reports current drug use. He reports that he does not drink alcohol.   Family History:  The patient's family history includes Alzheimer's disease in his mother; Hypertension in his father; Liver cancer in his father.   ROS:  Please see the history of present illness.   Otherwise, review of systems is positive for none.   All other systems are reviewed and negative.   PHYSICAL EXAM: VS:  BP 138/78   Pulse 72   Ht 6\' 3"  (1.905 m)   Wt 194 lb (88 kg)   SpO2 99%   BMI 24.25 kg/m  , BMI Body mass  index is 24.25 kg/m. GEN: Well nourished, well developed, in no acute distress  HEENT: normal  Neck: no JVD, carotid bruits, or masses Cardiac: RRR; no murmurs, rubs, or gallops,no edema  Respiratory:  clear to auscultation bilaterally, normal work of breathing GI: soft, nontender, nondistended, + BS MS: no deformity or atrophy  Skin: warm and dry Neuro:  Strength and sensation are intact Psych: euthymic mood, full affect  EKG:  EKG is ordered today. Personal review of the ekg ordered shows sinus rhythm, LVH with repolarization abnormality  Recent Labs: 07/27/2018: TSH 1.207 01/16/2019: ALT 10; BUN 12; Creatinine, Ser 1.16; Hemoglobin 14.6; Platelets 126.0; Potassium 3.9; Sodium 141    Lipid Panel     Component Value Date/Time   CHOL 185 01/16/2019 0820   TRIG 69.0 01/16/2019 0820   HDL 50.20 01/16/2019 0820   CHOLHDL 4 01/16/2019 0820   VLDL 13.8 01/16/2019 0820   LDLCALC 121 (H) 01/16/2019 0820     Wt Readings from Last 3 Encounters:  04/22/19 194 lb (88 kg)  01/16/19 191 lb 6 oz (86.8 kg)  12/21/18 186 lb (84.4 kg)      Other studies Reviewed: Additional studies/ records that were reviewed today include: TTE 04/11/17  Review of the above records today demonstrates:  - Left ventricle: The cavity size was normal. There was mild   concentric hypertrophy with moderate apical hypertrophy measuring   1.5 cm. Systolic function was normal. The estimated ejection   fraction was in the range of 60% to 65%. Wall motion was normal;   there were no regional wall motion abnormalities. Features are   consistent with a pseudonormal left ventricular filling pattern,   with concomitant abnormal relaxation and increased filling   pressure (grade 2 diastolic dysfunction). Doppler parameters are   consistent with indeterminate ventricular filling pressure. - Aortic valve: Transvalvular velocity was within the normal range.   There was no stenosis. There was mild regurgitation. -  Mitral valve: Transvalvular velocity was within the normal range.   There was no evidence for stenosis. There was mild regurgitation. - Left atrium: The atrium was moderately to severely dilated. - Right ventricle: The cavity size was normal. Wall thickness was   normal. Systolic function was normal. - Atrial septum: No defect or patent foramen ovale was identified. - Tricuspid valve: There was trivial regurgitation.  SPECT 04/11/17  Nuclear stress EF: 47%.  There was no ST segment deviation noted during stress.  This is a low risk study.  The left ventricular ejection fraction is mildly decreased (45-54%).   Normal resting and stress perfusion. No ischemia or infarction EF Estimated 47% with apical dyskinesis Suggest echo correlation For apical HOCM given ECG appearance   Holter 04/19/17 -  personally reviewed Baseline rhythm: Sinus Minimum heart rate: 40 BPM.  Average heart rate: 63 BPM.  Maximal heart rate 118 BPM. Symptoms: Patient had multiple episodes of fluttering sensation and this did not correlate with any significant arrhythmias  Conclusion:  Abnormal Holter monitoring.  Patient has baseline abnormal EKG with deep inverted T waves.  Occasional PACs and 715 PVCs in the monitoring with one 4 beat NSVT  CMRI 06/13/17 1. Mildly dilated left ventricle with severely thickened apical segments (up to 19 mm) with a typical spade-shape appearance of the left ventricular cavity. Basal segments have mild hypertrophy. Normal systolic function (LVEF = 64%). There are no regional wall motion abnormalities. There is diffuse late gadolinium enhancement in all apical segments.  2. Normal right ventricular size, thickness and systolic function (LVEF = 53%). There are no regional wall motion abnormalities.  3.  Moderately dilated left atrium.  4.  Mild aortic and mitral regurgitation.  5. Normal pericardium. Trivial pericardial effusion located posteriorly to the  heart.  Collectively, these findings are consistent with an apical form of hypertrophic cardiomyopathy with high risk feature - diffuse late gadolinium enhancement in all 4 apical segments.  ASSESSMENT AND PLAN:  1.  Apical hypertrophic cardiomyopathy: Hypertrophy of 19 mm with diffuse late gadolinium.  He has worn a cardiac monitor with minimal ventricular arrhythmias.  We Raiana Pharris hold off on ICD implant at this time.  He did have an insurance physical and was found to have a BNP which was elevated around 350.  He does not have any evidence of volume overload.  His weight has remained stable without lower extremity edema, abnormal lung exam, or JVD.  No changes.  We worked to get him in with his primary cardiologist.  2.  Hypertension: Mildly elevated today but is usually well controlled.  3.  Nonsustained VT: Rare ventricular arrhythmias.  No changes.  Current medicines are reviewed at length with the patient today.   The patient does not have concerns regarding his medicines.  The following changes were made today: None  Labs/ tests ordered today include:  Orders Placed This Encounter  Procedures  . EKG 12-Lead    Disposition:   FU with Bionca Mckey as needed months  Signed, Ladarrell Cornwall Meredith Leeds, MD  04/22/2019 2:24 PM     [facility] [address] [place] [address] [phone] (office) [phone] (fax)

## 2019-05-20 ENCOUNTER — Telehealth: Payer: Self-pay | Admitting: Family Medicine

## 2019-05-20 MED ORDER — ALBUTEROL SULFATE HFA 108 (90 BASE) MCG/ACT IN AERS: 1.0000 | INHALATION_SPRAY | Freq: Four times a day (QID) | RESPIRATORY_TRACT | 2 refills | Status: DC | PRN

## 2019-05-20 NOTE — Telephone Encounter (Signed)
Medication:  albuterol (VENTOLIN HFA) 108 (90 Base) MCG/ACT inhaler [id number]   Has the patient contacted their pharmacy? No. (If no, request that the patient contact the pharmacy for the refill.) (If yes, when and what did the pharmacy advise?)  Preferred Pharmacy (with phone number or street name): [id number] - [place], [place] [address]  [address], [place] [address]  Phone:  [phone] Fax:  [phone]  DEA #:  [id number]  Agent: Please be advised that RX refills may take up to 3 business days. We ask that you follow-up with your pharmacy.

## 2019-05-20 NOTE — Telephone Encounter (Signed)
Refill done.  

## 2019-05-22 ENCOUNTER — Other Ambulatory Visit: Payer: Self-pay | Admitting: Family Medicine

## 2019-05-23 ENCOUNTER — Other Ambulatory Visit: Payer: Self-pay | Admitting: Family Medicine

## 2019-05-23 ENCOUNTER — Telehealth: Payer: Self-pay | Admitting: Cardiology

## 2019-05-23 MED ORDER — ALBUTEROL SULFATE HFA 108 (90 BASE) MCG/ACT IN AERS: 1.0000 | INHALATION_SPRAY | Freq: Four times a day (QID) | RESPIRATORY_TRACT | 2 refills | Status: DC | PRN

## 2019-05-23 NOTE — Telephone Encounter (Signed)
   Pt is calling, he would like to know if he can get a echo and stress test.  Please advise

## 2019-05-23 NOTE — Telephone Encounter (Signed)
[facility] states that [facility] not receive prescription refill. Patient is calling to check status.   albuterol (VENTOLIN HFA) 108 (90 Base) MCG/ACT inhaler [id number]     Please advised

## 2019-05-23 NOTE — Telephone Encounter (Signed)
Will need appt first

## 2019-05-23 NOTE — Telephone Encounter (Signed)
Will forward request to Dr. Julien Nordmann office to address. Pt was PRN f/u w/ Dr. Curt Bears as of 04/22/19. Pt has not been seen by Revankar since 2019.

## 2019-05-23 NOTE — Telephone Encounter (Signed)
Medication sent.

## 2019-05-27 ENCOUNTER — Telehealth: Payer: Self-pay | Admitting: Family Medicine

## 2019-05-27 ENCOUNTER — Ambulatory Visit (INDEPENDENT_AMBULATORY_CARE_PROVIDER_SITE_OTHER): Payer: Medicare Other | Admitting: Cardiology

## 2019-05-27 ENCOUNTER — Other Ambulatory Visit: Payer: Self-pay

## 2019-05-27 ENCOUNTER — Encounter: Payer: Self-pay | Admitting: Cardiology

## 2019-05-27 VITALS — BP 122/74 | HR 54 | Temp 97.3°F | Ht 75.0 in | Wt 192.8 lb

## 2019-05-27 DIAGNOSIS — I10 Essential (primary) hypertension: Secondary | ICD-10-CM | POA: Diagnosis not present

## 2019-05-27 DIAGNOSIS — I472 Ventricular tachycardia: Secondary | ICD-10-CM

## 2019-05-27 DIAGNOSIS — I422 Other hypertrophic cardiomyopathy: Secondary | ICD-10-CM

## 2019-05-27 DIAGNOSIS — I4729 Other ventricular tachycardia: Secondary | ICD-10-CM

## 2019-05-27 HISTORY — DX: Other hypertrophic cardiomyopathy: I42.2

## 2019-05-27 NOTE — Telephone Encounter (Signed)
Pt states he needs a refill on an inhaler without the steroids (ProAir) it was prescribed by a previous provider. PT would like for [facility] to start prescribing it to him. [facility] [place]. Please Advise if Possible. Thanks

## 2019-05-27 NOTE — Progress Notes (Signed)
Cardiology Office Note:    Date:  05/27/2019   ID:  Justin Frazier, DOB Jun 08, 1960, MRN [id number]  PCP:  [name], DO  Cardiologist:  Jenean Lindau, MD   Referring MD: [name]*    ASSESSMENT:    1. NSVT (nonsustained ventricular tachycardia) ([place])   2. Essential hypertension   3. Apical variant hypertrophic cardiomyopathy ([place])    PLAN:    In order of problems listed above:  1. Apical hypertrophic cardiomyopathy: Secondary prevention stressed with the patient.  Importance of compliance with diet medication stressed and he vocalized understanding.  He is asymptomatic he denies any symptoms of palpitations syncope or any such problems.  I reviewed electrophysiology colleagues notes extensively. 2. Essential hypertension: Blood pressure is stable 3. Patient is fasting and wants complete blood work and we will do this for him.  He has had elevated BNP in the past and we will check it again.  We will send a copy of this to his primary care physician. 4. Patient will be seen in follow-up appointment in 6 months or earlier if the patient has any concerns    Medication Adjustments/Labs and Tests Ordered: Current medicines are reviewed at length with the patient today.  Concerns regarding medicines are outlined above.  No orders of the defined types were placed in this encounter.  No orders of the defined types were placed in this encounter.    No chief complaint on file.    History of Present Illness:    Justin Frazier is a 59 y.o. male.  Patient has past medical history of apical hypertrophic cardiomyopathy and essential hypertension.  He has nonsustained ventricular tachycardia.  He denies any problems at this time and takes care of activities of daily living.  No chest pain orthopnea or PND.  He leads a sedentary lifestyle and does not exercise on a regular basis.  At the time of my evaluation, the patient is alert awake oriented and in no  distress.  Past Medical History:  Diagnosis Date  . Abnormal EKG 03/20/2017  . Allergic rhinitis   . Carpal tunnel syndrome   . Central serous retinopathy   . CKD (chronic kidney disease), stage I   . Glaucoma   . Lumbar strain   . NSVT (nonsustained ventricular tachycardia) ([place])    Seen by Dr. Mina Marble (EP) recommended beta blocker therapy (2014)  . Proteinuria   . Sciatica of left side     Past Surgical History:  Procedure Laterality Date  . COLONOSCOPY  06/11/2013    Current Medications: Current Meds  Medication Sig  . albuterol (VENTOLIN HFA) 108 (90 Base) MCG/ACT inhaler Inhale 1-2 puffs into the lungs every 6 (six) hours as needed. Every 4-6 hours as needed  . aspirin EC 81 MG tablet Take 81 mg by mouth daily.   . Cholecalciferol 5000 units TABS Take 5,000 mg by mouth daily.  . dorzolamide-timolol (COSOPT) 22.3-6.8 MG/ML ophthalmic solution Place 1 drop into both eyes 2 (two) times daily.  Marland Kitchen lisinopril (ZESTRIL) 10 MG tablet Take 1 tablet (10 mg total) by mouth daily.  . metoprolol succinate (TOPROL-XL) 25 MG 24 hr tablet TAKE 1 TABLET BY MOUTH EVERY DAY  . montelukast (SINGULAIR) 10 MG tablet Take 1 tablet (10 mg total) by mouth daily.  . traMADol (ULTRAM) 50 MG tablet Take 2 tabs every 6 hours as needed for moderate-severe pain. Not to exceed 4 tabs daily.  . travoprost, benzalkonium, (TRAVATAN) 0.004 % ophthalmic solution  Place 1 drop into both eyes at bedtime.  . vitamin B-12 (CYANOCOBALAMIN) 1000 MCG tablet Take 1,000 mcg by mouth daily.     Allergies:   Patient has no known allergies.   Social History   Socioeconomic History  . Marital status: Married    Spouse name: Not on file  . Number of children: Not on file  . Years of education: Not on file  . Highest education level: Not on file  Occupational History  . Not on file  Tobacco Use  . Smoking status: Never Smoker  . Smokeless tobacco: Never Used  Substance and Sexual Activity  . Alcohol use: No  . Drug  use: Yes  . Sexual activity: Not Currently  Other Topics Concern  . Not on file  Social History Narrative  . Not on file   Social Determinants of Health   Financial Resource Strain:   . Difficulty of Paying Living Expenses:   Food Insecurity:   . Worried About Charity fundraiser in the Last Year:   . Arboriculturist in the Last Year:   Transportation Needs:   . Film/video editor (Medical):   Marland Kitchen Lack of Transportation (Non-Medical):   Physical Activity:   . Days of Exercise per Week:   . Minutes of Exercise per Session:   Stress:   . Feeling of Stress :   Social Connections:   . Frequency of Communication with Friends and Family:   . Frequency of Social Gatherings with Friends and Family:   . Attends Religious Services:   . Active Member of Clubs or Organizations:   . Attends Archivist Meetings:   Marland Kitchen Marital Status:      Family History: The patient's family history includes Alzheimer's disease in his mother; Hypertension in his father; Liver cancer in his father.  ROS:   Please see the history of present illness.    All other systems reviewed and are negative.  EKGs/Labs/Other Studies Reviewed:    The following studies were reviewed today: HOLTER MONITOR REPORT:    Date of test:                  04/11/2017 Duration of test:           48 hours Indication:                    Palpitations Ordering physician:         Jyl Heinz MD Referring physician:        Jyl Heinz MD   Baseline rhythm: Sinus  Minimum heart rate: 40 BPM.  Average heart rate: 63 BPM.  Maximal heart rate 118 BPM.  Atrial arrhythmia: None significant.   Ventricular arrhythmia: None significant patient had a 4 beat nonsustained ventricular tachycardia  Conduction abnormality: None significant  Symptoms: Patient had multiple episodes of fluttering sensation and this did not correlate with any significant arrhythmias   Conclusion:  Abnormal Holter  monitoring.  Patient has baseline abnormal EKG with deep inverted T waves.  Occasional PACs and 715 PVCs in the monitoring with one 4 beat NSVT  Interpreting  cardiologist: Jenean Lindau, MD  Date: 04/19/2017 4:35 PM   Study Highlights    Nuclear stress EF: 47%.  There was no ST segment deviation noted during stress.  This is a low risk study.  The left ventricular ejection fraction is mildly decreased (45-54%).   Normal resting and stress perfusion. No ischemia or infarction EF Estimated  47% with apical dyskinesis Suggest echo correlation For apical HOCM given ECG appearance     Impressions:   - Moderate hypertrophy of the apical septum with otherwise mild  concentric hypertrophy concerning for apical variant hypertrophic  cardiomyopathy. Consider cardiac MRI to better assess myocardial  IMPRESSION: 1. Mildly dilated left ventricle with severely thickened apical segments (up to 19 mm) with a typical spade-shape appearance of the left ventricular cavity. Basal segments have mild hypertrophy. Normal systolic function (LVEF = 64%). There are no regional wall motion abnormalities. There is diffuse late gadolinium enhancement in all apical segments.  2. Normal right ventricular size, thickness and systolic function (LVEF = 53%). There are no regional wall motion abnormalities.  3.  Moderately dilated left atrium.  4.  Mild aortic and mitral regurgitation.  5. Normal pericardium. Trivial pericardial effusion located posteriorly to the heart.  Collectively, these findings are consistent with an apical form of hypertrophic cardiomyopathy with high risk feature - diffuse late gadolinium enhancement in all 4 apical segments.   Electronically Signed   By: Ena Dawley   On: 06/13/2017 15:18     Recent Labs: 07/27/2018: TSH 1.207 01/16/2019: ALT 10; BUN 12; Creatinine, Ser 1.16; Hemoglobin 14.6; Platelets 126.0; Potassium 3.9; Sodium 141  Recent Lipid  Panel    Component Value Date/Time   CHOL 185 01/16/2019 0820   TRIG 69.0 01/16/2019 0820   HDL 50.20 01/16/2019 0820   CHOLHDL 4 01/16/2019 0820   VLDL 13.8 01/16/2019 0820   LDLCALC 121 (H) 01/16/2019 0820    Physical Exam:    VS:  BP 122/74   Pulse (!) 54   Temp (!) 97.3 F (36.3 C)   Ht 6\' 3"  (1.905 m)   Wt 192 lb 12.8 oz (87.5 kg)   SpO2 100%   BMI 24.10 kg/m     Wt Readings from Last 3 Encounters:  05/27/19 192 lb 12.8 oz (87.5 kg)  04/22/19 194 lb (88 kg)  01/16/19 191 lb 6 oz (86.8 kg)     GEN: Patient is in no acute distress HEENT: Normal NECK: No JVD; No carotid bruits LYMPHATICS: No lymphadenopathy CARDIAC: Hear sounds regular, 2/6 systolic murmur at the apex. RESPIRATORY:  Clear to auscultation without rales, wheezing or rhonchi  ABDOMEN: Soft, non-tender, non-distended MUSCULOSKELETAL:  No edema; No deformity  SKIN: Warm and dry NEUROLOGIC:  Alert and oriented x 3 PSYCHIATRIC:  Normal affect   Signed, Jenean Lindau, MD  05/27/2019 9:26 AM    [place]

## 2019-05-27 NOTE — Patient Instructions (Signed)
Medication Instructions:  No medication changes *If you need a refill on your cardiac medications before your next appointment, please call your pharmacy*   Lab Work: Your physician recommends that you have labs today in the office. You had a basic metabolic panel, magnesium, complete blood count, thyroid stimulating hormone, liver function, BNP and lipids.  If you have labs (blood work) drawn today and your tests are completely normal, you will receive your results only by: Marland Kitchen MyChart Message (if you have MyChart) OR . A paper copy in the mail If you have any lab test that is abnormal or we need to change your treatment, we will call you to review the results.   Testing/Procedures: None ordered   Follow-Up: At [facility], you and your health needs are our priority.  As part of our continuing mission to provide you with exceptional heart care, we have created designated Provider Care Teams.  These Care Teams include your primary Cardiologist (physician) and Advanced Practice Providers (APPs -  Physician Assistants and Nurse Practitioners) who all work together to provide you with the care you need, when you need it.  We recommend signing up for the patient portal called "MyChart".  Sign up information is provided on this After Visit Summary.  MyChart is used to connect with patients for Virtual Visits (Telemedicine).  Patients are able to view lab/test results, encounter notes, upcoming appointments, etc.  Non-urgent messages can be sent to your provider as well.   To learn more about what you can do with MyChart, go to [email or web address].    Your next appointment:   6 month(s)  The format for your next appointment:   In Person  Provider:   Jyl Heinz, MD   Other Instructions NA

## 2019-05-28 LAB — BASIC METABOLIC PANEL
BUN/Creatinine Ratio: 11 (ref 9–20)
BUN: 12 mg/dL (ref 6–24)
CO2: 21 mmol/L (ref 20–29)
Calcium: 9.4 mg/dL (ref 8.7–10.2)
Chloride: 108 mmol/L — ABNORMAL HIGH (ref 96–106)
Creatinine, Ser: 1.1 mg/dL (ref 0.76–1.27)
GFR calc Af Amer: 85 mL/min/{1.73_m2} (ref >59)
GFR calc non Af Amer: 74 mL/min/{1.73_m2} (ref >59)
Glucose: 88 mg/dL (ref 65–99)
Potassium: 4 mmol/L (ref 3.5–5.2)
Sodium: 142 mmol/L (ref 134–144)

## 2019-05-28 LAB — CBC WITH DIFFERENTIAL/PLATELET
Basophils Absolute: 0 10*3/uL (ref 0.0–0.2)
Basos: 0 %
EOS (ABSOLUTE): 0.1 10*3/uL (ref 0.0–0.4)
Eos: 1 %
Hematocrit: 40.6 % (ref 37.5–51.0)
Hemoglobin: 13.8 g/dL (ref 13.0–17.7)
Immature Grans (Abs): 0 10*3/uL (ref 0.0–0.1)
Immature Granulocytes: 0 %
Lymphocytes Absolute: 1.5 10*3/uL (ref 0.7–3.1)
Lymphs: 21 %
MCH: 31.1 pg (ref 26.6–33.0)
MCHC: 34 g/dL (ref 31.5–35.7)
MCV: 91 fL (ref 79–97)
Monocytes Absolute: 0.6 10*3/uL (ref 0.1–0.9)
Monocytes: 8 %
Neutrophils Absolute: 5.2 10*3/uL (ref 1.4–7.0)
Neutrophils: 70 %
Platelets: 156 10*3/uL (ref 150–450)
RBC: 4.44 x10E6/uL (ref 4.14–5.80)
RDW: 12.3 % (ref 11.6–15.4)
WBC: 7.5 10*3/uL (ref 3.4–10.8)

## 2019-05-28 LAB — HEPATIC FUNCTION PANEL
ALT: 9 IU/L (ref 0–44)
AST: 18 IU/L (ref 0–40)
Albumin: 4.1 g/dL (ref 3.8–4.9)
Alkaline Phosphatase: 86 IU/L (ref 39–117)
Bilirubin Total: 0.8 mg/dL (ref 0.0–1.2)
Bilirubin, Direct: 0.2 mg/dL (ref 0.00–0.40)
Total Protein: 6.8 g/dL (ref 6.0–8.5)

## 2019-05-28 LAB — LIPID PANEL
Chol/HDL Ratio: 3.9 ratio (ref 0.0–5.0)
Cholesterol, Total: 184 mg/dL (ref 100–199)
HDL: 47 mg/dL (ref >39)
LDL Chol Calc (NIH): 125 mg/dL — ABNORMAL HIGH (ref 0–99)
Triglycerides: 61 mg/dL (ref 0–149)
VLDL Cholesterol Cal: 12 mg/dL (ref 5–40)

## 2019-05-28 LAB — TSH: TSH: 1.24 u[IU]/mL (ref 0.450–4.500)

## 2019-05-28 LAB — BRAIN NATRIURETIC PEPTIDE: BNP: 61.7 pg/mL (ref 0.0–100.0)

## 2019-05-28 LAB — MAGNESIUM: Magnesium: 1.8 mg/dL (ref 1.6–2.3)

## 2019-05-28 MED ORDER — ALBUTEROL SULFATE HFA 108 (90 BASE) MCG/ACT IN AERS: 1.0000 | INHALATION_SPRAY | Freq: Four times a day (QID) | RESPIRATORY_TRACT | 2 refills | Status: DC | PRN

## 2019-05-28 NOTE — Addendum Note (Signed)
Addended by: Sharon Seller B on: 05/28/2019 09:17 AM   Modules accepted: Orders

## 2019-05-28 NOTE — Telephone Encounter (Signed)
OK 

## 2019-05-28 NOTE — Telephone Encounter (Signed)
Sent in

## 2019-07-16 ENCOUNTER — Ambulatory Visit: Payer: Medicare Other | Admitting: Family Medicine

## 2019-07-17 ENCOUNTER — Ambulatory Visit (INDEPENDENT_AMBULATORY_CARE_PROVIDER_SITE_OTHER): Payer: Medicare Other | Admitting: Family Medicine

## 2019-07-17 ENCOUNTER — Other Ambulatory Visit: Payer: Self-pay

## 2019-07-17 ENCOUNTER — Encounter: Payer: Self-pay | Admitting: Family Medicine

## 2019-07-17 VITALS — BP 118/80 | HR 50 | Temp 96.1°F | Ht 75.0 in | Wt 187.5 lb

## 2019-07-17 DIAGNOSIS — E785 Hyperlipidemia, unspecified: Secondary | ICD-10-CM | POA: Diagnosis not present

## 2019-07-17 DIAGNOSIS — D696 Thrombocytopenia, unspecified: Secondary | ICD-10-CM

## 2019-07-17 DIAGNOSIS — I10 Essential (primary) hypertension: Secondary | ICD-10-CM | POA: Diagnosis not present

## 2019-07-17 DIAGNOSIS — M25512 Pain in left shoulder: Secondary | ICD-10-CM

## 2019-07-17 DIAGNOSIS — G8929 Other chronic pain: Secondary | ICD-10-CM

## 2019-07-17 LAB — CBC
HCT: 40.1 % (ref 39.0–52.0)
Hemoglobin: 13.3 g/dL (ref 13.0–17.0)
MCHC: 33.2 g/dL (ref 30.0–36.0)
MCV: 94.4 fl (ref 78.0–100.0)
Platelets: 171 10*3/uL (ref 150.0–400.0)
RBC: 4.25 Mil/uL (ref 4.22–5.81)
RDW: 13.9 % (ref 11.5–15.5)
WBC: 4.1 10*3/uL (ref 4.0–10.5)

## 2019-07-17 LAB — COMPREHENSIVE METABOLIC PANEL
ALT: 9 U/L (ref 0–53)
AST: 15 U/L (ref 0–37)
Albumin: 4.1 g/dL (ref 3.5–5.2)
Alkaline Phosphatase: 60 U/L (ref 39–117)
BUN: 14 mg/dL (ref 6–23)
CO2: 27 mEq/L (ref 19–32)
Calcium: 9.2 mg/dL (ref 8.4–10.5)
Chloride: 107 mEq/L (ref 96–112)
Creatinine, Ser: 1.06 mg/dL (ref 0.40–1.50)
GFR: 86.49 mL/min (ref >60.00)
Glucose, Bld: 85 mg/dL (ref 70–99)
Potassium: 3.9 mEq/L (ref 3.5–5.1)
Sodium: 139 mEq/L (ref 135–145)
Total Bilirubin: 0.9 mg/dL (ref 0.2–1.2)
Total Protein: 7 g/dL (ref 6.0–8.3)

## 2019-07-17 LAB — LIPID PANEL
Cholesterol: 164 mg/dL (ref 0–200)
HDL: 41.4 mg/dL (ref >39.00)
LDL Cholesterol: 107 mg/dL — ABNORMAL HIGH (ref 0–99)
NonHDL: 122.69
Total CHOL/HDL Ratio: 4
Triglycerides: 76 mg/dL (ref 0.0–149.0)
VLDL: 15.2 mg/dL (ref 0.0–40.0)

## 2019-07-17 MED ORDER — TRAMADOL HCL 50 MG PO TABS: ORAL_TABLET | ORAL | 3 refills | Status: DC

## 2019-07-17 NOTE — Patient Instructions (Signed)
If you do not hear anything about your referral in the next 1-2 weeks, call our office and ask for an update.  Continue the stretches/exercises.  OK to take Tylenol 1000 mg (2 extra strength tabs) or 975 mg (3 regular strength tabs) every 6 hours as needed.  Ibuprofen 400-600 mg (2-3 over the counter strength tabs) every 6 hours as needed for pain.  Give [place] 2-3 business days to get the results of your labs back.   Let us know if you need anything.

## 2019-07-17 NOTE — Progress Notes (Signed)
Chief Complaint  Patient presents with  . Shoulder Pain    left    Subjective Justin Frazier is a 59 y.o. male who presents for hypertension follow up. He does monitor home blood pressures. Blood pressures ranging from 120's/80's on average. He is compliant with medications- lisinopril 10 mg/d, Toprol 25 mg/d. Patient has these side effects of medication: none He is adhering to a healthy diet overall. Current exercise: walking  Chronic L shoulder pain continues. He has been using Tylenol, NSAIDs, Tramadol, stretches/exercises. ROM improving, but still having pain in certain positions and decreased ROM. No bruising, redness, numbness/tingling, swelling.    Past Medical History:  Diagnosis Date  . Abnormal EKG 03/20/2017  . Allergic rhinitis   . Carpal tunnel syndrome   . Central serous retinopathy   . CKD (chronic kidney disease), stage I   . Glaucoma   . Lumbar strain   . NSVT (nonsustained ventricular tachycardia) ([place])    Seen by Dr. Mina Marble (EP) recommended beta blocker therapy (2014)  . Proteinuria   . Sciatica of left side     Review of Systems Cardiovascular: no chest pain Respiratory:  no shortness of breath  Exam BP 118/80 (BP Location: Left Arm, Patient Position: Sitting, Cuff Size: Normal)   Pulse (!) 50   Temp (!) 96.1 F (35.6 C) (Temporal)   Ht 6\' 3"  (1.905 m)   Wt 187 lb 8 oz (85 kg)   SpO2 100%   BMI 23.44 kg/m  General:  well developed, well nourished, in no apparent distress Heart: RRR, no bruits, no LE edema Lungs: clear to auscultation, no accessory muscle use MSK: Decreased active/passive ROM, no ttp, +speed's, Empty can, Neer's, neg lift off, cross over, Hawkins on L shoulder Psych: well oriented with normal range of affect and appropriate judgment/insight  Chronic left shoulder pain - Plan: Ambulatory referral to Sports Medicine  Essential hypertension - Plan: Comprehensive metabolic panel  Hyperlipidemia, unspecified hyperlipidemia type -  Plan: Lipid panel  Thrombocytopenia ([place]) - Plan: CBC  1- Refer sports med. Suspect frozen shoulder in addition to initial issue. Cont stretches/exercises. Refill pain medication until he gets in.  2- Cont meds F/u in 6 mo for CPE or prn. The patient voiced understanding and agreement to the plan.  [place], DO 07/17/19  11:13 AM

## 2019-07-26 ENCOUNTER — Encounter: Payer: Self-pay | Admitting: Family Medicine

## 2019-07-26 ENCOUNTER — Ambulatory Visit: Payer: Self-pay

## 2019-07-26 ENCOUNTER — Other Ambulatory Visit: Payer: Self-pay

## 2019-07-26 ENCOUNTER — Ambulatory Visit (INDEPENDENT_AMBULATORY_CARE_PROVIDER_SITE_OTHER): Payer: Medicare Other | Admitting: Family Medicine

## 2019-07-26 VITALS — BP 154/82 | HR 46 | Ht 75.0 in | Wt 185.0 lb

## 2019-07-26 DIAGNOSIS — M7502 Adhesive capsulitis of left shoulder: Secondary | ICD-10-CM

## 2019-07-26 HISTORY — DX: Adhesive capsulitis of left shoulder: M75.02

## 2019-07-26 MED ORDER — TRIAMCINOLONE ACETONIDE 40 MG/ML IJ SUSP
40.0000 mg | Freq: Once | INTRAMUSCULAR | Status: AC
  Administered 2019-07-26: 40 mg via INTRA_ARTICULAR

## 2019-07-26 NOTE — Assessment & Plan Note (Signed)
Has limitation on external rotation to suggest a frozen shoulder. -Injection. -Counseled on home exercise therapy and supportive care. -Could consider Toradol intra-articular injection if no improvement. -Consider imaging and physical therapy.

## 2019-07-26 NOTE — Patient Instructions (Signed)
Nice to meet you Please try heat before the exercises and ice after  Please try the exercises   Please send me a message in MyChart with any questions or updates.  Please see me back in 4 weeks.   --Dr. Raeford Razor

## 2019-07-26 NOTE — Progress Notes (Signed)
Justin Frazier - 59 y.o. male MRN [id number]  Date of birth: [birth year]  SUBJECTIVE:  Including CC & ROS.  Chief Complaint  Patient presents with  . Shoulder Pain    left    Justin Frazier is a 59 y.o. male that is presenting with left shoulder pain.  The pain is been ongoing for 6 months.  The pain is localized to the shoulder.  He has had limitations with his range of motion.  No specific inciting event.  No history of surgery.  Pain is intermittent in nature.     Review of Systems See HPI   HISTORY: Past Medical, Surgical, Social, and Family History Reviewed & Updated per EMR.   Pertinent Historical Findings include:  Past Medical History:  Diagnosis Date  . Abnormal EKG 03/20/2017  . Allergic rhinitis   . Carpal tunnel syndrome   . Central serous retinopathy   . CKD (chronic kidney disease), stage I   . Glaucoma   . Lumbar strain   . NSVT (nonsustained ventricular tachycardia) ([place])    Seen by Dr. Mina Marble (EP) recommended beta blocker therapy (2014)  . Proteinuria   . Sciatica of left side     Past Surgical History:  Procedure Laterality Date  . COLONOSCOPY  06/11/2013    Family History  Problem Relation Age of Onset  . Alzheimer's disease Mother   . Hypertension Father   . Liver cancer Father     Social History   Socioeconomic History  . Marital status: Married    Spouse name: Not on file  . Number of children: Not on file  . Years of education: Not on file  . Highest education level: Not on file  Occupational History  . Not on file  Tobacco Use  . Smoking status: Never Smoker  . Smokeless tobacco: Never Used  Vaping Use  . Vaping Use: Never used  Substance and Sexual Activity  . Alcohol use: No  . Drug use: Yes  . Sexual activity: Not Currently  Other Topics Concern  . Not on file  Social History Narrative  . Not on file   Social Determinants of Health   Financial Resource Strain:   . Difficulty of Paying Living Expenses:   Food  Insecurity:   . Worried About Charity fundraiser in the Last Year:   . Arboriculturist in the Last Year:   Transportation Needs:   . Film/video editor (Medical):   Marland Kitchen Lack of Transportation (Non-Medical):   Physical Activity:   . Days of Exercise per Week:   . Minutes of Exercise per Session:   Stress:   . Feeling of Stress :   Social Connections:   . Frequency of Communication with Friends and Family:   . Frequency of Social Gatherings with Friends and Family:   . Attends Religious Services:   . Active Member of Clubs or Organizations:   . Attends Archivist Meetings:   Marland Kitchen Marital Status:   Intimate Partner Violence:   . Fear of Current or Ex-Partner:   . Emotionally Abused:   Marland Kitchen Physically Abused:   . Sexually Abused:      PHYSICAL EXAM:  VS: BP (!) 154/82   Pulse (!) 46   Ht 6\' 3"  (1.905 m)   Wt 185 lb (83.9 kg)   BMI 23.12 kg/m  Physical Exam Gen: NAD, alert, cooperative with exam, well-appearing MSK:  Left shoulder: Normal internal rotation. Limited external rotation and abduction.  No pain with empty can testing. Limited passive flexion and abduction. Neurovascularly intact   Aspiration/Injection Procedure Note Justin Frazier 30-Sep-1960  Procedure: Injection Indications: Left shoulder pain  Procedure Details Consent: Risks of procedure as well as the alternatives and risks of each were explained to the (patient/caregiver).  Consent for procedure obtained. Time Out: Verified patient identification, verified procedure, site/side was marked, verified correct patient position, special equipment/implants available, medications/allergies/relevent history reviewed, required imaging and test results available.  Performed.  The area was cleaned with iodine and alcohol swabs.    The left glenohumeral joint was injected using 3 cc's 1% lidocaine, 1 cc's of 40 mg Kenalog and 3 cc's of 0.25% bupivacaine with a 22 3 1/2" needle.  Ultrasound was used.  Images were obtained in short views showing the injection.     A sterile dressing was applied.  Patient did tolerate procedure well.     ASSESSMENT & PLAN:   Adhesive capsulitis of left shoulder Has limitation on external rotation to suggest a frozen shoulder. -Injection. -Counseled on home exercise therapy and supportive care. -Could consider Toradol intra-articular injection if no improvement. -Consider imaging and physical therapy.

## 2019-08-23 ENCOUNTER — Ambulatory Visit: Payer: Medicare Other | Admitting: Family Medicine

## 2019-08-26 ENCOUNTER — Encounter: Payer: Self-pay | Admitting: Family Medicine

## 2019-08-26 ENCOUNTER — Other Ambulatory Visit: Payer: Self-pay

## 2019-08-26 ENCOUNTER — Ambulatory Visit (INDEPENDENT_AMBULATORY_CARE_PROVIDER_SITE_OTHER): Payer: Medicare Other | Admitting: Family Medicine

## 2019-08-26 DIAGNOSIS — M7502 Adhesive capsulitis of left shoulder: Secondary | ICD-10-CM

## 2019-08-26 NOTE — Assessment & Plan Note (Signed)
Doing well since the injection.  His range of motion is near normal. -Counseled on home exercise therapy and supportive care. -Follow-up in a few months to recheck to make sure his range of motion continues to improve.  Could consider physical therapy.

## 2019-08-26 NOTE — Progress Notes (Signed)
Justin Frazier - 59 y.o. male MRN [id number]  Date of birth: 28-Jun-1960  SUBJECTIVE:  Including CC & ROS.  Chief Complaint  Patient presents with  . Follow-up    left shoulder    Justin Frazier is a 59 y.o. male that is following up for his left shoulder pain.  He still has mild limitations in flexion but his external rotation has improved significantly.  His pain is improved as well.  Denies any pain waking him up at night.   Review of Systems See HPI   HISTORY: Past Medical, Surgical, Social, and Family History Reviewed & Updated per EMR.   Pertinent Historical Findings include:  Past Medical History:  Diagnosis Date  . Abnormal EKG 03/20/2017  . Allergic rhinitis   . Carpal tunnel syndrome   . Central serous retinopathy   . CKD (chronic kidney disease), stage I   . Glaucoma   . Lumbar strain   . NSVT (nonsustained ventricular tachycardia) ([place])    Seen by Dr. Mina Marble (EP) recommended beta blocker therapy (2014)  . Proteinuria   . Sciatica of left side     Past Surgical History:  Procedure Laterality Date  . COLONOSCOPY  06/11/2013    Family History  Problem Relation Age of Onset  . Alzheimer's disease Mother   . Hypertension Father   . Liver cancer Father     Social History   Socioeconomic History  . Marital status: Married    Spouse name: Not on file  . Number of children: Not on file  . Years of education: Not on file  . Highest education level: Not on file  Occupational History  . Not on file  Tobacco Use  . Smoking status: Never Smoker  . Smokeless tobacco: Never Used  Vaping Use  . Vaping Use: Never used  Substance and Sexual Activity  . Alcohol use: No  . Drug use: Yes  . Sexual activity: Not Currently  Other Topics Concern  . Not on file  Social History Narrative  . Not on file   Social Determinants of Health   Financial Resource Strain:   . Difficulty of Paying Living Expenses:   Food Insecurity:   . Worried About Sales executive in the Last Year:   . Arboriculturist in the Last Year:   Transportation Needs:   . Film/video editor (Medical):   Marland Kitchen Lack of Transportation (Non-Medical):   Physical Activity:   . Days of Exercise per Week:   . Minutes of Exercise per Session:   Stress:   . Feeling of Stress :   Social Connections:   . Frequency of Communication with Friends and Family:   . Frequency of Social Gatherings with Friends and Family:   . Attends Religious Services:   . Active Member of Clubs or Organizations:   . Attends Archivist Meetings:   Marland Kitchen Marital Status:   Intimate Partner Violence:   . Fear of Current or Ex-Partner:   . Emotionally Abused:   Marland Kitchen Physically Abused:   . Sexually Abused:      PHYSICAL EXAM:  VS: BP (!) 145/73   Pulse (!) 45   Ht 6\' 3"  (1.905 m)   Wt 185 lb (83.9 kg)   BMI 23.12 kg/m  Physical Exam Gen: NAD, alert, cooperative with exam, well-appearing MSK:  Left shoulder: External rotation is near normal. Limitations with flexion. Normal empty can test. Neurovascular intact     ASSESSMENT &  PLAN:   Adhesive capsulitis of left shoulder Doing well since the injection.  His range of motion is near normal. -Counseled on home exercise therapy and supportive care. -Follow-up in a few months to recheck to make sure his range of motion continues to improve.  Could consider physical therapy.

## 2019-09-11 ENCOUNTER — Other Ambulatory Visit: Payer: Self-pay | Admitting: Family Medicine

## 2019-10-14 ENCOUNTER — Other Ambulatory Visit: Payer: Self-pay | Admitting: Family Medicine

## 2019-11-22 ENCOUNTER — Other Ambulatory Visit: Payer: Self-pay

## 2019-11-22 DIAGNOSIS — R809 Proteinuria, unspecified: Secondary | ICD-10-CM | POA: Insufficient documentation

## 2019-11-22 DIAGNOSIS — M5432 Sciatica, left side: Secondary | ICD-10-CM | POA: Insufficient documentation

## 2019-11-22 DIAGNOSIS — H35719 Central serous chorioretinopathy, unspecified eye: Secondary | ICD-10-CM | POA: Insufficient documentation

## 2019-11-22 DIAGNOSIS — J309 Allergic rhinitis, unspecified: Secondary | ICD-10-CM | POA: Insufficient documentation

## 2019-11-22 DIAGNOSIS — S39012A Strain of muscle, fascia and tendon of lower back, initial encounter: Secondary | ICD-10-CM | POA: Insufficient documentation

## 2019-11-22 DIAGNOSIS — G5600 Carpal tunnel syndrome, unspecified upper limb: Secondary | ICD-10-CM | POA: Insufficient documentation

## 2019-11-22 DIAGNOSIS — N181 Chronic kidney disease, stage 1: Secondary | ICD-10-CM | POA: Insufficient documentation

## 2019-11-22 DIAGNOSIS — H409 Unspecified glaucoma: Secondary | ICD-10-CM | POA: Insufficient documentation

## 2019-11-25 ENCOUNTER — Ambulatory Visit (INDEPENDENT_AMBULATORY_CARE_PROVIDER_SITE_OTHER): Payer: Medicare Other | Admitting: Cardiology

## 2019-11-25 ENCOUNTER — Other Ambulatory Visit: Payer: Self-pay

## 2019-11-25 ENCOUNTER — Encounter: Payer: Self-pay | Admitting: Cardiology

## 2019-11-25 VITALS — BP 126/72 | HR 55 | Ht 75.0 in | Wt 187.0 lb

## 2019-11-25 DIAGNOSIS — I4729 Other ventricular tachycardia: Secondary | ICD-10-CM

## 2019-11-25 DIAGNOSIS — I422 Other hypertrophic cardiomyopathy: Secondary | ICD-10-CM | POA: Diagnosis not present

## 2019-11-25 DIAGNOSIS — I472 Ventricular tachycardia: Secondary | ICD-10-CM | POA: Diagnosis not present

## 2019-11-25 NOTE — Progress Notes (Signed)
Cardiology Office Note:    Date:  11/25/2019   ID:  [name], DOB [birth year], MRN [id number]  PCP:  [name], DO  Cardiologist:  Jenean Lindau, MD   Referring MD: [name]*    ASSESSMENT:    1. Apical variant hypertrophic cardiomyopathy ([place])   2. NSVT (nonsustained ventricular tachycardia) (HCC)    PLAN:    In order of problems listed above:  1. Primary prevention stressed with the patient.  Importance of compliance with diet medication stressed and he vocalized understanding. 2. Essential hypertension: Blood pressure stable and diet was emphasized.  He exercises appropriately.  He walks more than half an hour a day on a daily basis. 3. Apical hypertrophic of left ventricle: Stable at this time.  Electrophysiologist notes were reviewed and discussed with the patient and questions were answered to his satisfaction. 4. Dyslipidemia: Diet was explained and emphasized and lifestyle modification was urged this is followed by his primary care physician. 5. Patient will be seen in follow-up appointment in 6 months or earlier if the patient has any concerns    Medication Adjustments/Labs and Tests Ordered: Current medicines are reviewed at length with the patient today.  Concerns regarding medicines are outlined above.  No orders of the defined types were placed in this encounter.  No orders of the defined types were placed in this encounter.    No chief complaint on file.    History of Present Illness:    Justin Frazier is a 59 y.o. male.  Patient denies any history of chest pain orthopnea or PND and exercises on a regular basis.  He has history of essential hypertension and apical left ventricular hypertrophy.  No palpitations syncope or any such issues.  I reviewed electrophysiology notes extensively.  Past Medical History:  Diagnosis Date  . Abnormal EKG 03/20/2017  . Adhesive capsulitis of left shoulder 07/26/2019  . Allergic  rhinitis   . Apical variant hypertrophic cardiomyopathy ([place]) 05/27/2019  . Carpal tunnel syndrome   . Central serous retinopathy   . CKD (chronic kidney disease), stage I   . DOE (dyspnea on exertion) 03/30/2017  . Enlarged lymph node 01/16/2019  . Glaucoma   . HTN (hypertension) 03/20/2017  . Lumbar strain   . LVH (left ventricular hypertrophy) 03/20/2017  . Mild intermittent asthma without complication 05/07/5571  . 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 Concern over right parotid mass. See HPI. Examination reveals a mobile 1 cm mass in the immediate right preauricular area.  No surrounding adenopathy.  Head and neck is otherwise negative for nodes. PLAN: We discussed most tumors of the parotid gland are benign.  Needle biopsy is helpful to determine the nature.  Marland Kitchen NSVT (nonsustained ventricular tachycardia) ([place])    Seen by Dr. Mina Marble (EP) recommended beta blocker therapy (2014)  . Proteinuria   . Renal cyst, left 03/08/2018  . Sciatica of left side   . Thrombocytopenia ([place]) 02/01/2018  . Unintentional weight loss of more than 10 pounds 08/31/2018  . Well adult exam 03/30/2017    Past Surgical History:  Procedure Laterality Date  . COLONOSCOPY  06/11/2013    Current Medications: Current Meds  Medication Sig  . albuterol (VENTOLIN HFA) 108 (90 Base) MCG/ACT inhaler Inhale 1-2 puffs into the lungs every 6 (six) hours as needed. Every 4-6 hours as needed  . aspirin EC 81 MG tablet Take  81 mg by mouth daily.   . Cholecalciferol 5000 units TABS Take 5,000 mg by mouth daily.  . dorzolamide-timolol (COSOPT) 22.3-6.8 MG/ML ophthalmic solution Place 1 drop into both eyes 2 (two) times daily.  Marland Kitchen lisinopril (ZESTRIL) 10 MG tablet TAKE 1 TABLET BY MOUTH EVERY DAY  . metoprolol succinate (TOPROL-XL) 25 MG 24 hr tablet TAKE 1 TABLET BY MOUTH EVERY DAY  . montelukast (SINGULAIR) 10 MG tablet Take 1 tablet (10 mg  total) by mouth daily.  Marland Kitchen SIMBRINZA 1-0.2 % SUSP   . traMADol (ULTRAM) 50 MG tablet Take 2 tabs every 6 hours as needed for moderate-severe pain. Not to exceed 4 tabs daily.  . travoprost, benzalkonium, (TRAVATAN) 0.004 % ophthalmic solution Place 1 drop into both eyes at bedtime.  . vitamin B-12 (CYANOCOBALAMIN) 1000 MCG tablet Take 1,000 mcg by mouth daily.     Allergies:   Patient has no known allergies.   Social History   Socioeconomic History  . Marital status: Married    Spouse name: Not on file  . Number of children: Not on file  . Years of education: Not on file  . Highest education level: Not on file  Occupational History  . Not on file  Tobacco Use  . Smoking status: Never Smoker  . Smokeless tobacco: Never Used  Vaping Use  . Vaping Use: Never used  Substance and Sexual Activity  . Alcohol use: No  . Drug use: Yes  . Sexual activity: Not Currently  Other Topics Concern  . Not on file  Social History Narrative  . Not on file   Social Determinants of Health   Financial Resource Strain:   . Difficulty of Paying Living Expenses: Not on file  Food Insecurity:   . Worried About Charity fundraiser in the Last Year: Not on file  . Ran Out of Food in the Last Year: Not on file  Transportation Needs:   . Lack of Transportation (Medical): Not on file  . Lack of Transportation (Non-Medical): Not on file  Physical Activity:   . Days of Exercise per Week: Not on file  . Minutes of Exercise per Session: Not on file  Stress:   . Feeling of Stress : Not on file  Social Connections:   . Frequency of Communication with Friends and Family: Not on file  . Frequency of Social Gatherings with Friends and Family: Not on file  . Attends Religious Services: Not on file  . Active Member of Clubs or Organizations: Not on file  . Attends Archivist Meetings: Not on file  . Marital Status: Not on file     Family History: The patient's family history includes  Alzheimer's disease in his mother; Hypertension in his father; Liver cancer in his father.  ROS:   Please see the history of present illness.    All other systems reviewed and are negative.  EKGs/Labs/Other Studies Reviewed:    The following studies were reviewed today: I discussed my findings with the patient at length   Recent Labs: 05/27/2019: BNP 61.7; Magnesium 1.8; TSH 1.240 07/17/2019: ALT 9; BUN 14; Creatinine, Ser 1.06; Hemoglobin 13.3; Platelets 171.0; Potassium 3.9; Sodium 139  Recent Lipid Panel    Component Value Date/Time   CHOL 164 07/17/2019 1058   CHOL 184 05/27/2019 0931   TRIG 76.0 07/17/2019 1058   HDL 41.40 07/17/2019 1058   HDL 47 05/27/2019 0931   CHOLHDL 4 07/17/2019 1058   VLDL 15.2 07/17/2019 1058  LDLCALC 107 (H) 07/17/2019 1058   LDLCALC 125 (H) 05/27/2019 0931    Physical Exam:    VS:  BP 126/72   Pulse (!) 55   Ht 6\' 3"  (1.905 m)   Wt 187 lb (84.8 kg)   SpO2 99%   BMI 23.37 kg/m     Wt Readings from Last 3 Encounters:  11/25/19 187 lb (84.8 kg)  08/26/19 185 lb (83.9 kg)  07/26/19 185 lb (83.9 kg)     GEN: Patient is in no acute distress HEENT: Normal NECK: No JVD; No carotid bruits LYMPHATICS: No lymphadenopathy CARDIAC: Hear sounds regular, 2/6 systolic murmur at the apex. RESPIRATORY:  Clear to auscultation without rales, wheezing or rhonchi  ABDOMEN: Soft, non-tender, non-distended MUSCULOSKELETAL:  No edema; No deformity  SKIN: Warm and dry NEUROLOGIC:  Alert and oriented x 3 PSYCHIATRIC:  Normal affect   Signed, Jenean Lindau, MD  11/25/2019 10:34 AM    [place]

## 2019-11-25 NOTE — Patient Instructions (Signed)

## 2020-01-20 ENCOUNTER — Ambulatory Visit (HOSPITAL_BASED_OUTPATIENT_CLINIC_OR_DEPARTMENT_OTHER)
Admission: RE | Admit: 2020-01-20 | Discharge: 2020-01-20 | Disposition: A | Discharge status: Home / Self Care | Payer: Medicare Other | Source: Ambulatory Visit | Attending: Family Medicine | Admitting: Family Medicine

## 2020-01-20 ENCOUNTER — Other Ambulatory Visit: Payer: Self-pay

## 2020-01-20 ENCOUNTER — Ambulatory Visit (INDEPENDENT_AMBULATORY_CARE_PROVIDER_SITE_OTHER): Payer: Medicare Other | Admitting: Family Medicine

## 2020-01-20 ENCOUNTER — Other Ambulatory Visit: Payer: Self-pay | Admitting: Family Medicine

## 2020-01-20 ENCOUNTER — Encounter: Payer: Self-pay | Admitting: Family Medicine

## 2020-01-20 VITALS — BP 130/80 | HR 54 | Temp 98.4°F | Ht 75.0 in | Wt 193.0 lb

## 2020-01-20 DIAGNOSIS — Z0000 Encounter for general adult medical examination without abnormal findings: Secondary | ICD-10-CM

## 2020-01-20 DIAGNOSIS — E785 Hyperlipidemia, unspecified: Secondary | ICD-10-CM

## 2020-01-20 DIAGNOSIS — Z23 Encounter for immunization: Secondary | ICD-10-CM

## 2020-01-20 DIAGNOSIS — Z0001 Encounter for general adult medical examination with abnormal findings: Secondary | ICD-10-CM

## 2020-01-20 DIAGNOSIS — M1612 Unilateral primary osteoarthritis, left hip: Secondary | ICD-10-CM | POA: Diagnosis not present

## 2020-01-20 DIAGNOSIS — Z125 Encounter for screening for malignant neoplasm of prostate: Secondary | ICD-10-CM

## 2020-01-20 DIAGNOSIS — D696 Thrombocytopenia, unspecified: Secondary | ICD-10-CM

## 2020-01-20 DIAGNOSIS — R1032 Left lower quadrant pain: Secondary | ICD-10-CM | POA: Insufficient documentation

## 2020-01-20 HISTORY — DX: Left lower quadrant pain: R10.32

## 2020-01-20 HISTORY — DX: Hyperlipidemia, unspecified: E78.5

## 2020-01-20 LAB — LIPID PANEL
Cholesterol: 220 mg/dL — ABNORMAL HIGH (ref 0–200)
HDL: 54.2 mg/dL (ref >39.00)
LDL Cholesterol: 148 mg/dL — ABNORMAL HIGH (ref 0–99)
NonHDL: 165.42
Total CHOL/HDL Ratio: 4
Triglycerides: 87 mg/dL (ref 0.0–149.0)
VLDL: 17.4 mg/dL (ref 0.0–40.0)

## 2020-01-20 LAB — PSA: PSA: 2.48 ng/mL (ref 0.10–4.00)

## 2020-01-20 LAB — CBC
HCT: 45.5 % (ref 39.0–52.0)
Hemoglobin: 15 g/dL (ref 13.0–17.0)
MCHC: 32.9 g/dL (ref 30.0–36.0)
MCV: 95.2 fl (ref 78.0–100.0)
Platelets: 132 10*3/uL — ABNORMAL LOW (ref 150.0–400.0)
RBC: 4.78 Mil/uL (ref 4.22–5.81)
RDW: 14.3 % (ref 11.5–15.5)
WBC: 4.7 10*3/uL (ref 4.0–10.5)

## 2020-01-20 LAB — COMPREHENSIVE METABOLIC PANEL
ALT: 10 U/L (ref 0–53)
AST: 15 U/L (ref 0–37)
Albumin: 4.2 g/dL (ref 3.5–5.2)
Alkaline Phosphatase: 60 U/L (ref 39–117)
BUN: 11 mg/dL (ref 6–23)
CO2: 27 mEq/L (ref 19–32)
Calcium: 9.3 mg/dL (ref 8.4–10.5)
Chloride: 105 mEq/L (ref 96–112)
Creatinine, Ser: 1.24 mg/dL (ref 0.40–1.50)
GFR: 63.58 mL/min (ref >60.00)
Glucose, Bld: 86 mg/dL (ref 70–99)
Potassium: 3.9 mEq/L (ref 3.5–5.1)
Sodium: 141 mEq/L (ref 135–145)
Total Bilirubin: 0.9 mg/dL (ref 0.2–1.2)
Total Protein: 7 g/dL (ref 6.0–8.3)

## 2020-01-20 MED ORDER — TRAMADOL HCL 50 MG PO TABS: ORAL_TABLET | ORAL | 3 refills | Status: DC

## 2020-01-20 MED ORDER — MONTELUKAST SODIUM 10 MG PO TABS: 10.0000 mg | ORAL_TABLET | Freq: Every day | ORAL | 2 refills | Status: DC

## 2020-01-20 NOTE — Patient Instructions (Addendum)
Give [place] 2-3 business days to get the results of your labs back.   Keep the diet clean and stay active.  The new Shingrix vaccine (for shingles) is a 2 shot series. It can make people feel low energy, achy and almost like they have the flu for 48 hours after injection. Please plan accordingly when deciding on when to get this shot. Call our office for a nurse visit appointment to get this. The second shot of the series is less severe regarding the side effects, but it still lasts 48 hours.   I do recommend getting the booster.   Let us know if you need anything.   Hip Exercises It is normal to feel mild stretching, pulling, tightness, or discomfort as you do these exercises, but you should stop right away if you feel sudden pain or your pain gets worse.  STRETCHING AND RANGE OF MOTION EXERCISES These exercises warm up your muscles and joints and improve the movement and flexibility of your hip. These exercises also help to relieve pain, numbness, and tingling. Exercise A: Hamstrings, Supine  1. Lie on your back. 2. Loop a belt or towel over the ball of your left / rightfoot. The ball of your foot is on the walking surface, right under your toes. 3. Straighten your left / rightknee and slowly pull on the belt to raise your leg. ? Do not let your left / right knee bend while you do this. ? Keep your other leg flat on the floor. ? Raise the left / right leg until you feel a gentle stretch behind your left / right knee or thigh. 4. Hold this position for 30 seconds. 5. Slowly return your leg to the starting position. Repeat2 times. Complete this stretch 3 times per week. Exercise B: Hip Rotators  1. Lie on your back on a firm surface. 2. Hold your left / right knee with your left / right hand. Hold your ankle with your other hand. 3. Gently pull your left / right knee and rotate your lower leg toward your other shoulder. ? Pull until you feel a stretch in your buttocks. ? Keep your  hips and shoulders firmly planted while you do this stretch. 4. Hold this position for 30 seconds. Repeat 2 times. Complete this stretch 3 times per week. Exercise C: V-Sit (Hamstrings and Adductors)  1. Sit on the floor with your legs extended in a large "V" shape. Keep your knees straight during this exercise. 2. Start with your head and chest upright, then bend at your waist to reach for your left foot (position A). You should feel a stretch in your right inner thigh. 3. Hold this position for 30 seconds. Then slowly return to the upright position. 4. Bend at your waist to reach forward (position B). You should feel a stretch behind both of your thighs and knees. 5. Hold this position for 30 seconds. Then slowly return to the upright position. 6. Bend at your waist to reach for your right foot (position C). You should feel a stretch in your left inner thigh. 7. Hold this position for 30 seconds. Then slowly return to the upright position. Repeat A, B, and C 2 times each. Complete this stretch 3 times per week. Exercise D: Lunge (Hip Flexors)  1. Place your left / right knee on the floor and bend your other knee so that is directly over your ankle. You should be half-kneeling. 2. Keep good posture with your head over your shoulders.  3. Tighten your buttocks to point your tailbone downward. This helps your back to keep from arching too much. 4. You should feel a gentle stretch in the front of your left / right thigh and hip. If you do not feel any resistance, slightly slide your other foot forward and then slowly lunge forward so your knee once again lines up over your ankle. 5. Make sure your tailbone continues to point downward. 6. Hold this position for 30 seconds. Repeat 2 times. Complete this stretch 3 times per week.  STRENGTHENING EXERCISES These exercises build strength and endurance in your hip. Endurance is the ability to use your muscles for a long time, even after they get  tired. Exercise E: Bridge (Hip Extensors)  1. Lie on your back on a firm surface with your knees bent and your feet flat on the floor. 2. Tighten your buttocks muscles and lift your bottom off the floor until the trunk of your body is level with your thighs. ? Do not arch your back. ? You should feel the muscles working in your buttocks and the back of your thighs. If you do not feel these muscles, slide your feet 1-2 inches (2.5-5 cm) farther away from your buttocks. 3. Hold this position for 3 seconds. 4. Slowly lower your hips to the starting position. Repeat for a total of 10 repetitions. 5. Let your muscles relax completely between repetitions. 6. If this exercise is too easy, try doing it with your arms crossed over your chest. Repeat 2 times. Complete this exercise 3 times per week. Exercise F: Straight Leg Raises - Hip Abductors  1. Lie on your side with your left / right leg in the top position. Lie so your head, shoulder, knee, and hip line up with each other. You may bend your bottom knee to help you balance. 2. Roll your hips slightly forward, so your hips are stacked directly over each other and your left / right knee is facing forward. 3. Leading with your heel, lift your top leg 4-6 inches (10-15 cm). You should feel the muscles in your outer hip lifting. ? Do not let your foot drift forward. ? Do not let your knee roll toward the ceiling. 4. Hold this position for 1 second. 5. Slowly return to the starting position. 6. Let your muscles relax completely between repetitions. Repeat for a total of 10 repetitions.  Repeat 2 times. Complete this exercise 3 times per week. Exercise G: Straight Leg Raises - Hip Adductors  1. Lie on your side with your left / right leg in the bottom position. Lie so your head, shoulder, knee, and hip line up. You may place your upper foot in front to help you balance. 2. Roll your hips slightly forward, so your hips are stacked directly over each  other and your left / right knee is facing forward. 3. Tense the muscles in your inner thigh and lift your bottom leg 4-6 inches (10-15 cm). 4. Hold this position for 1 second. 5. Slowly return to the starting position. 6. Let your muscles relax completely between repetitions. Repeat for a total of 10 repetitions. Repeat 2 times. Complete this exercise 3 times per week. Exercise H: Straight Leg Raises - Quadriceps  1. Lie on your back with your left / right leg extended and your other knee bent. 2. Tense the muscles in the front of your left / right thigh. When you do this, you should see your kneecap slide up or see increased  dimpling just above your knee. 3. Tighten these muscles even more and raise your leg 4-6 inches (10-15 cm) off the floor. 4. Hold this position for 3 seconds. 5. Keep these muscles tense as you lower your leg. 6. Relax the muscles slowly and completely between repetitions. Repeat for a total of 10 repetitions. Repeat 2 times. Complete this exercise 3 times per week. Exercise I: Hip Abductors, Standing 1. Tie one end of a rubber exercise band or tubing to a secure surface, such as a table or pole. 2. Loop the other end of the band or tubing around your left / right ankle. 3. Keeping your ankle with the band or tubing directly opposite of the secured end, step away until there is tension in the tubing or band. Hold onto a chair as needed for balance. 4. Lift your left / right leg out to your side. While you do this: ? Keep your back upright. ? Keep your shoulders over your hips. ? Keep your toes pointing forward. ? Make sure to use your hip muscles to lift your leg. Do not "throw" your leg or tip your body to lift your leg. 5. Hold this position for 1 second. 6. Slowly return to the starting position. Repeat for a total of 10 repetitions. Repeat 2 times. Complete this exercise 3 times per week. Exercise J: Squats (Quadriceps) 1. Stand in a door frame so your feet and  knees are in line with the frame. You may place your hands on the frame for balance. 2. Slowly bend your knees and lower your hips like you are going to sit in a chair. ? Keep your lower legs in a straight-up-and-down position. ? Do not let your hips go lower than your knees. ? Do not bend your knees lower than told by your health care provider. ? If your hip pain increases, do not bend as low. 3. Hold this position for 1 second. 4. Slowly push with your legs to return to standing. Do not use your hands to pull yourself to standing. Repeat for a total of 10 repetitions. Repeat 2 times. Complete this exercise 3 times per week. Make sure you discuss any questions you have with your health care provider. Document Released: 02/18/2005 Document Revised: 10/26/2015 Document Reviewed: 01/26/2015 Elsevier Interactive Patient Education  [employer].

## 2020-01-20 NOTE — Progress Notes (Signed)
Chief Complaint  Patient presents with  . Follow-up    Well Male Justin Frazier is here for a complete physical.   His last physical was >1 year ago.  Current diet: in general, a "healthy" diet.  Current exercise: walking Weight trend: stable Fatigue out of ordinary? No. Seat belt? Yes.    Health maintenance Shingrix- No Colonoscopy- Yes Tetanus- No HIV- Yes Hep C- Yes   L hip pain 2-3 mo worsening, mainly when he bends down. No mechanical catching or locking. No inj or change in activity. Some OA in family. No redness, swelling, bruising. Tried some basic stretching with minimal relief.    Past Medical History:  Diagnosis Date  . Abnormal EKG 03/20/2017  . Adhesive capsulitis of left shoulder 07/26/2019  . Allergic rhinitis   . Apical variant hypertrophic cardiomyopathy ([place]) 05/27/2019  . Carpal tunnel syndrome   . Central serous retinopathy   . CKD (chronic kidney disease), stage I   . Enlarged lymph node 01/16/2019  . Glaucoma   . HTN (hypertension) 03/20/2017  . Lumbar strain   . LVH (left ventricular hypertrophy) 03/20/2017  . Mild intermittent asthma without complication 5/46/2703  . 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 Concern over right parotid mass. See HPI. Examination reveals a mobile 1 cm mass in the immediate right preauricular area.  No surrounding adenopathy.  Head and neck is otherwise negative for nodes. PLAN: We discussed most tumors of the parotid gland are benign.  Needle biopsy is helpful to determine the nature.  Marland Kitchen NSVT (nonsustained ventricular tachycardia) ([place])    Seen by Dr. Mina Marble (EP) recommended beta blocker therapy (2014)  . Proteinuria   . Renal cyst, left 03/08/2018  . Sciatica of left side   . Thrombocytopenia ([place]) 02/01/2018  . Unintentional weight loss of more than 10 pounds 08/31/2018      Past Surgical History:  Procedure Laterality Date  .  COLONOSCOPY  06/11/2013    Medications  Current Outpatient Medications on File Prior to Visit  Medication Sig Dispense Refill  . albuterol (VENTOLIN HFA) 108 (90 Base) MCG/ACT inhaler Inhale 1-2 puffs into the lungs every 6 (six) hours as needed. Every 4-6 hours as needed 18 g 2  . aspirin EC 81 MG tablet Take 81 mg by mouth daily.     . Cholecalciferol 5000 units TABS Take 5,000 mg by mouth daily.    . dorzolamide-timolol (COSOPT) 22.3-6.8 MG/ML ophthalmic solution Place 1 drop into both eyes 2 (two) times daily.    Marland Kitchen lisinopril (ZESTRIL) 10 MG tablet TAKE 1 TABLET BY MOUTH EVERY DAY 90 tablet 1  . metoprolol succinate (TOPROL-XL) 25 MG 24 hr tablet TAKE 1 TABLET BY MOUTH EVERY DAY 90 tablet 2  . montelukast (SINGULAIR) 10 MG tablet Take 1 tablet (10 mg total) by mouth daily. 90 tablet 2  . SIMBRINZA 1-0.2 % SUSP     . traMADol (ULTRAM) 50 MG tablet Take 2 tabs every 6 hours as needed for moderate-severe pain. Not to exceed 4 tabs daily. 30 tablet 3  . travoprost, benzalkonium, (TRAVATAN) 0.004 % ophthalmic solution Place 1 drop into both eyes at bedtime.    . vitamin B-12 (CYANOCOBALAMIN) 1000 MCG tablet Take 1,000 mcg by mouth daily.     Allergies No Known Allergies  Family History Family History  Problem Relation Age of Onset  . Alzheimer's disease Mother   . Hypertension  Father   . Liver cancer Father     Review of Systems: Constitutional:  no fevers Eye:  no recent significant change in vision Ear/Nose/Mouth/Throat:  Ears:  no hearing loss Nose/Mouth/Throat:  no complaints of nasal congestion, no sore throat Cardiovascular:  no chest pain Respiratory:  no shortness of breath Gastrointestinal:  no change in bowel habits GU:  Male: negative for dysuria, frequency Musculoskeletal/Extremities: L hip pain Integumentary (Skin/Breast): +lump on back of leg Neurologic:  no headaches Endocrine: No unexpected weight changes Hematologic/Lymphatic:  no abnormal  bleeding  Exam BP 130/80 (BP Location: Left Arm, Patient Position: Sitting, Cuff Size: Normal)   Pulse (!) 54   Temp 98.4 F (36.9 C) (Oral)   Ht 6\' 3"  (1.905 m)   Wt 193 lb (87.5 kg)   SpO2 98%   BMI 24.12 kg/m  General:  well developed, well nourished, in no apparent distress Skin: There is a rubbery lesion in the posterior distal L thigh region without ttp, erythema, fluctuance, drainage. It is approx 4 cm x 2 cm in dimensions and freely moveable. Otherwise no significant moles, warts, or growths Head:  no masses, lesions, or tenderness Eyes:  pupils equal and round, sclera anicteric without injection Ears:  canals without lesions, TMs shiny without retraction, no obvious effusion, no erythema Nose:  nares patent, septum midline, mucosa normal Throat/Pharynx:  lips and gingiva without lesion; tongue and uvula midline; non-inflamed pharynx; no exudates or postnasal drainage Neck: neck supple without adenopathy, thyromegaly, or masses Cardiac: Reg rhythm, bradycardic, no bruits, no LE edema Lungs:  clear to auscultation, breath sounds equal bilaterally, no respiratory distress Rectal: Deferred Musculoskeletal:  symmetrical muscle groups noted without atrophy or deformity L hip: equivocal [place] as it caused low back pain in him, neg log roll, FABER, FADDIR; mild ttp over the L prox hip flexor region, no bony ttp, no deformity Neuro:  gait normal; deep tendon reflexes normal and symmetric Psych: well oriented with normal range of affect and appropriate judgment/insight  Assessment and Plan  Encounter for well adult exam with abnormal findings  Left groin pain - Plan: DG Hip Unilat W OR W/O Pelvis 2-3 Views Left  Screening for prostate cancer - Plan: PSA  Hyperlipidemia, unspecified hyperlipidemia type - Plan: Lipid panel, Comprehensive metabolic panel  Thrombocytopenia ([place]) - Plan: CBC  Need for Tdap vaccination - Plan: Tdap vaccine greater than or equal to 27yo IM    Well 59 y.o. male. Counseled on diet and exercise. Counseled on risks and benefits of prostate cancer screening with PSA. The patient agrees to undergo testing. L groin pain: I did give him hip stretches/exercises. Will ck XR to r/o OA as this would change our management. May need PT if no better in 3-4 weeks, he will message [place] if this is the case and XR is neg.  Immunizations, labs, and further orders as above. Rec'd Shingrix and covid booster.  Follow up in 6 mo or prn. The patient voiced understanding and agreement to the plan.  [place], DO 01/20/20 7:42 AM

## 2020-02-28 ENCOUNTER — Encounter: Payer: Self-pay | Admitting: Family Medicine

## 2020-02-28 ENCOUNTER — Other Ambulatory Visit: Payer: Self-pay

## 2020-02-28 ENCOUNTER — Other Ambulatory Visit (INDEPENDENT_AMBULATORY_CARE_PROVIDER_SITE_OTHER): Payer: Medicare Other

## 2020-02-28 ENCOUNTER — Telehealth: Payer: Self-pay | Admitting: Family Medicine

## 2020-02-28 DIAGNOSIS — E785 Hyperlipidemia, unspecified: Secondary | ICD-10-CM | POA: Diagnosis not present

## 2020-02-28 LAB — LIPID PANEL
Cholesterol: 216 mg/dL — ABNORMAL HIGH (ref 0–200)
HDL: 51.1 mg/dL (ref >39.00)
LDL Cholesterol: 142 mg/dL — ABNORMAL HIGH (ref 0–99)
NonHDL: 164.59
Total CHOL/HDL Ratio: 4
Triglycerides: 111 mg/dL (ref 0.0–149.0)
VLDL: 22.2 mg/dL (ref 0.0–40.0)

## 2020-02-28 NOTE — Telephone Encounter (Signed)
PCP completed///called the patient informed ready for pickup Will put at the front desk

## 2020-02-28 NOTE — Telephone Encounter (Signed)
Pt dropped off document to be filled out by provider ( 1 page Parking Disability parking Placard) Pt would like to be called when document ready at [phone]. Document put at front office tray under provider names.

## 2020-03-02 ENCOUNTER — Other Ambulatory Visit: Payer: Medicare Other

## 2020-03-09 ENCOUNTER — Telehealth: Payer: Self-pay | Admitting: Family Medicine

## 2020-03-09 NOTE — Telephone Encounter (Signed)
Pt dropped off document to be filled out by provider ( 2 pages - also with envelope) Pt would like to be called when document ready to pick up at [phone]. Document put at front office tray under providers name.

## 2020-03-10 NOTE — Telephone Encounter (Signed)
PCP completed///patient informed to pickup at the front desk.

## 2020-03-18 ENCOUNTER — Telehealth: Payer: Self-pay | Admitting: Cardiology

## 2020-03-18 DIAGNOSIS — I422 Other hypertrophic cardiomyopathy: Secondary | ICD-10-CM

## 2020-03-18 NOTE — Telephone Encounter (Signed)
Pt states his last echo was 2 years ago and he feels that he should have a repeat. Pt denies any problems. How do you advise?

## 2020-03-18 NOTE — Addendum Note (Signed)
Addended by: Truddie Hidden on: 03/18/2020 02:17 PM   Modules accepted: Orders

## 2020-03-18 NOTE — Telephone Encounter (Signed)
Patient is requesting an order to have an echocardiogram. 

## 2020-03-18 NOTE — Telephone Encounter (Signed)
Please set him up for an echo

## 2020-04-17 ENCOUNTER — Other Ambulatory Visit: Payer: Self-pay

## 2020-04-17 ENCOUNTER — Ambulatory Visit (INDEPENDENT_AMBULATORY_CARE_PROVIDER_SITE_OTHER): Payer: Medicare Other

## 2020-04-17 DIAGNOSIS — I422 Other hypertrophic cardiomyopathy: Secondary | ICD-10-CM

## 2020-04-17 DIAGNOSIS — R931 Abnormal findings on diagnostic imaging of heart and coronary circulation: Secondary | ICD-10-CM

## 2020-04-17 LAB — ECHOCARDIOGRAM COMPLETE
Area-P 1/2: 2.84 cm2
P 1/2 time: 709 msec
S' Lateral: 3.05 cm

## 2020-04-17 MED ORDER — PERFLUTREN LIPID MICROSPHERE
1.0000 mL | INTRAVENOUS | Status: AC | PRN
  Administered 2020-04-17: 6 mL via INTRAVENOUS

## 2020-04-17 NOTE — Progress Notes (Signed)
Complete echocardiogram with contrast performed.  Jimmy Bryce Cheever RDCS, RVT  

## 2020-04-21 ENCOUNTER — Telehealth: Payer: Self-pay | Admitting: Cardiology

## 2020-04-21 ENCOUNTER — Encounter: Payer: Self-pay | Admitting: Cardiology

## 2020-04-21 NOTE — Telephone Encounter (Signed)
Left message for patient regarding the Monday 05/25/20 9:00 am Cardiac MRI appointment at [facility]---arrival time is 8:30 am--1st floor admissions office for check in.  Will mail information to patient and requested he call with questions or concerns.

## 2020-04-30 ENCOUNTER — Telehealth: Payer: Self-pay | Admitting: Family Medicine

## 2020-04-30 NOTE — Telephone Encounter (Signed)
Patient is inquiring about some labs he took in 2019 Pt would like a call back [phone]  Please advice

## 2020-04-30 NOTE — Telephone Encounter (Signed)
Spoke with patient regarding labs.  Patient questioning which hematologist he was referred to in 03/2017.  After chart review, patient was not referred because labs stabilized.  Copy of labs emailed to patient per request.

## 2020-05-21 ENCOUNTER — Other Ambulatory Visit: Payer: Self-pay | Admitting: Family Medicine

## 2020-05-22 ENCOUNTER — Telehealth (HOSPITAL_COMMUNITY): Payer: Self-pay | Admitting: *Deleted

## 2020-05-22 ENCOUNTER — Other Ambulatory Visit: Payer: Self-pay

## 2020-05-22 DIAGNOSIS — I10 Essential (primary) hypertension: Secondary | ICD-10-CM

## 2020-05-22 DIAGNOSIS — B351 Tinea unguium: Secondary | ICD-10-CM

## 2020-05-22 DIAGNOSIS — M545 Low back pain, unspecified: Secondary | ICD-10-CM | POA: Insufficient documentation

## 2020-05-22 HISTORY — DX: Essential (primary) hypertension: I10

## 2020-05-22 HISTORY — DX: Tinea unguium: B35.1

## 2020-05-22 HISTORY — DX: Low back pain, unspecified: M54.50

## 2020-05-22 NOTE — Telephone Encounter (Signed)
Reaching out to patient to offer assistance regarding upcoming cardiac imaging study; pt verbalizes understanding of appt date/time, parking situation and where to check in, and verified current allergies; name and call back number provided for further questions should they arise  Axel Frisk RN Navigator Cardiac Imaging [facility] [phone] office [phone] cell  

## 2020-05-25 ENCOUNTER — Ambulatory Visit (INDEPENDENT_AMBULATORY_CARE_PROVIDER_SITE_OTHER): Payer: Medicare Other | Admitting: Cardiology

## 2020-05-25 ENCOUNTER — Other Ambulatory Visit: Payer: Self-pay

## 2020-05-25 ENCOUNTER — Encounter: Payer: Self-pay | Admitting: Cardiology

## 2020-05-25 ENCOUNTER — Ambulatory Visit (HOSPITAL_COMMUNITY)
Admission: RE | Admit: 2020-05-25 | Discharge: 2020-05-25 | Disposition: A | Discharge status: Home / Self Care | Payer: Medicare Other | Source: Ambulatory Visit | Attending: Cardiology | Admitting: Cardiology

## 2020-05-25 VITALS — BP 132/86 | HR 58 | Ht 75.0 in | Wt 180.2 lb

## 2020-05-25 DIAGNOSIS — I422 Other hypertrophic cardiomyopathy: Secondary | ICD-10-CM

## 2020-05-25 DIAGNOSIS — E782 Mixed hyperlipidemia: Secondary | ICD-10-CM

## 2020-05-25 DIAGNOSIS — R931 Abnormal findings on diagnostic imaging of heart and coronary circulation: Secondary | ICD-10-CM | POA: Insufficient documentation

## 2020-05-25 DIAGNOSIS — I10 Essential (primary) hypertension: Secondary | ICD-10-CM | POA: Diagnosis not present

## 2020-05-25 DIAGNOSIS — I472 Ventricular tachycardia, unspecified: Secondary | ICD-10-CM

## 2020-05-25 MED ORDER — GADOBUTROL 1 MMOL/ML IV SOLN
9.0000 mL | Freq: Once | INTRAVENOUS | Status: AC | PRN
  Administered 2020-05-25: 9 mL via INTRAVENOUS

## 2020-05-25 NOTE — Patient Instructions (Signed)

## 2020-05-25 NOTE — Progress Notes (Signed)
Cardiology Office Note:    Date:  05/25/2020   ID:  Justin Frazier, DOB 1960/03/02, MRN [id number]  PCP:  [name], DO  Cardiologist:  Jenean Lindau, MD   Referring MD: [name]*    ASSESSMENT:    1. Ventricular tachycardia ([place])   2. Apical variant hypertrophic cardiomyopathy ([place])   3. Benign essential hypertension   4. Mixed hyperlipidemia    PLAN:    In order of problems listed above:  1. Apical hypertrophic cardiomyopathy: He has been recommended medical therapy in the past.  Cardiac MRI is now diagnostic.  Patient again has had no symptoms.  I would like to do 1 month monitoring to see if he has any increasing arrhythmia potential.  Patient is agreeable.  We will do a 1 month monitoring with active transmission.  He wants to begin monitoring process next week. 2. Essential hypertension: Blood pressure stable and diet was emphasized. 3. Mixed dyslipidemia: Lipids were reviewed diet was emphasized.  He has significantly elevated LDL and I cautioned him about this.  He understands and will do better with diet exercise. 4. Patient will be seen in follow-up appointment in 6 months or earlier if the patient has any concerns    Medication Adjustments/Labs and Tests Ordered: Current medicines are reviewed at length with the patient today.  Concerns regarding medicines are outlined above.  No orders of the defined types were placed in this encounter.  No orders of the defined types were placed in this encounter.    No chief complaint on file.    History of Present Illness:    Justin Frazier is a 60 y.o. male.  Patient has past medical history of apical hypertrophic cardiomyopathy, nonsustained ventricular tachycardia.  He has elevated cholesterol.  He denies any problems at this time and takes care of activities of daily living.  No chest pain orthopnea or PND.  No dizziness or any syncopal spells.  At the time of my evaluation, the  patient is alert awake oriented and in no distress.  Past Medical History:  Diagnosis Date  . Abnormal EKG 03/20/2017  . Adhesive capsulitis of left shoulder 07/26/2019  . Allergic rhinitis   . Apical variant hypertrophic cardiomyopathy ([place]) 05/27/2019  . Benign essential hypertension 05/22/2020  . Carpal tunnel syndrome   . Central serous retinopathy   . CKD (chronic kidney disease), stage I   . DOE (dyspnea on exertion) 03/30/2017  . Enlarged lymph node 01/16/2019  . Glaucoma   . HTN (hypertension) 03/20/2017  . Hyperlipidemia 01/20/2020  . Left groin pain 01/20/2020  . Low back pain 05/22/2020  . Lumbar strain   . LVH (left ventricular hypertrophy) 03/20/2017  . Mild intermittent asthma without complication 4/85/4627  . 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 Concern over right parotid mass. See HPI. Examination reveals a mobile 1 cm mass in the immediate right preauricular area.  No surrounding adenopathy.  Head and neck is otherwise negative for nodes. PLAN: We discussed most tumors of the parotid gland are benign.  Needle biopsy is helpful to determine the nature.  Marland Kitchen NSVT (nonsustained ventricular tachycardia) ([place])    Seen by Dr. Mina Marble (EP) recommended beta blocker therapy (2014)  . Onychomycosis 05/22/2020  . Proteinuria   . Renal cyst, left 03/08/2018  . Sciatica of left side   . Thrombocytopenia ([place]) 02/01/2018  . Tinea unguium 05/22/2020  .  Unintentional weight loss of more than 10 pounds 08/31/2018  . Well adult exam 03/30/2017    Past Surgical History:  Procedure Laterality Date  . COLONOSCOPY  06/11/2013    Current Medications: Current Meds  Medication Sig  . albuterol (VENTOLIN HFA) 108 (90 Base) MCG/ACT inhaler Inhale 1-2 puffs into the lungs every 6 (six) hours as needed for wheezing or shortness of breath.  Marland Kitchen aspirin EC 81 MG tablet Take 81 mg by mouth daily.   . Cholecalciferol 5000 units  TABS Take 5,000 mg by mouth daily.  . dorzolamide-timolol (COSOPT) 22.3-6.8 MG/ML ophthalmic solution Place 1 drop into both eyes 2 (two) times daily.  Marland Kitchen lisinopril (ZESTRIL) 10 MG tablet Take 10 mg by mouth daily.  . metoprolol succinate (TOPROL-XL) 25 MG 24 hr tablet Take 25 mg by mouth daily.  . montelukast (SINGULAIR) 10 MG tablet Take 1 tablet (10 mg total) by mouth daily.  . RHOPRESSA 0.02 % SOLN Place 1 drop into both eyes daily.  . traMADol (ULTRAM) 50 MG tablet Take 2 tabs every 6 hours as needed for moderate-severe pain. Not to exceed 4 tabs daily.  . travoprost, benzalkonium, (TRAVATAN) 0.004 % ophthalmic solution Place 1 drop into both eyes at bedtime.  . vitamin B-12 (CYANOCOBALAMIN) 1000 MCG tablet Take 1,000 mcg by mouth daily.     Allergies:   Brinzolamide-brimonidine   Social History   Socioeconomic History  . Marital status: Married    Spouse name: Not on file  . Number of children: Not on file  . Years of education: Not on file  . Highest education level: Not on file  Occupational History  . Not on file  Tobacco Use  . Smoking status: Never Smoker  . Smokeless tobacco: Never Used  Vaping Use  . Vaping Use: Never used  Substance and Sexual Activity  . Alcohol use: No  . Drug use: Yes  . Sexual activity: Not Currently  Other Topics Concern  . Not on file  Social History Narrative  . Not on file   Social Determinants of Health   Financial Resource Strain: Not on file  Food Insecurity: Not on file  Transportation Needs: Not on file  Physical Activity: Not on file  Stress: Not on file  Social Connections: Not on file     Family History: The patient's family history includes Alzheimer's disease in his mother; Hypertension in his father; Liver cancer in his father.  ROS:   Please see the history of present illness.    All other systems reviewed and are negative.  EKGs/Labs/Other Studies Reviewed:    The following studies were reviewed  today: CLINICAL DATA:  Clinical question of hypertrophic cardiomyopathy 60 year old African American Male  Study assume a HCT of 72  EXAM: CARDIAC MRI  TECHNIQUE: The patient was scanned on a 1.5 Tesla GE magnet. A dedicated cardiac coil was used. Functional imaging was done using Fiesta sequences. 2,3, and 4 chamber views were done to assess for RWMA's. Modified Simpson's rule using a short axis stack was used to calculate an ejection fraction on a dedicated work Conservation officer, nature. The patient received 9 cc of Gadavist. After 10 minutes inversion recovery sequences were used to assess for infiltration and scar tissue.  CONTRAST:  9 cc  of Gadavist  FINDINGS: 1. Normal left ventricular size, with LVEDD 53 mm, and LVEDVi 76 mL/m2.  There is no basal hypertrophy with intraventricular septal thickness of 5 mm, but there is mid intraventricular  septum thickness of 16 mm with apical hypertrophy of 19 mm, and an apex to base ratio > 1.5 (3.8).  Normal  myocardial mass index: 70 g/m2.  There is no apical aneurysm.  There is no velocity decoding suggestive of mid-cavitary gradient.  Normal left ventricular systolic function (LVEF =44%). There are no regional wall motion abnormalities.  Left ventricular parametric mapping notable for elevated ECV signal in the apex (apical lateral ECV signal highest at 39%) and elevated T2 signal at the apex (true apex 56 ms).  There is late gadolinium enhancement in the left ventricular myocardium: Patchy in the mid inferior and apex.  Quantitative LGE assessment using 6SD ROI approach is less than 15% (2.4 %).  2. Normal right ventricular size with RVEDVI 72 mL/m2.  Normal right ventricular thickness.  Normal right ventricular systolic function (RVEF =81 %). There are no regional wall motion abnormalities or aneurysms.  3. Mild left atrial dilation and normal right atrial size, with LAESV 39 mL/m2 and  RAESV 22 mL/m2.  4. Normal size of the aortic root, ascending aorta and pulmonary artery.  5.  No significant valvular abnormalities.  6.  Normal pericardium.  No pericardial effusion.  7. Grossly, no extracardiac findings. Recommended dedicated study if concerned for non-cardiac pathology.  IMPRESSION: 1. Mid intraventricular septum thickness of 16 mm with apical hypertrophy of 19 mm, and an apex to base ratio > 1.5 (3.8). There is no apical aneurysm. There is no velocity decoding suggestive of mid-cavitary gradient. This is suggestive of apical hypertrophic cardiomyopathy.  2. There is late gadolinium enhancement in the left ventricular myocardium: Patchy in the mid inferior and apex. Quantitative LGE assessment using 6SD ROI approach is less than 15% of total LV myocardium (2.4 %).  3.  Normal left ventricular systolic function (LVEF =85%).  4. Mild left atrial dilation.  Rudean Haskell MD    Recent Labs: 05/27/2019: BNP 61.7; Magnesium 1.8; TSH 1.240 01/20/2020: ALT 10; BUN 11; Creatinine, Ser 1.24; Hemoglobin 15.0; Platelets 132.0; Potassium 3.9; Sodium 141  Recent Lipid Panel    Component Value Date/Time   CHOL 216 (H) 02/28/2020 0715   CHOL 184 05/27/2019 0931   TRIG 111.0 02/28/2020 0715   HDL 51.10 02/28/2020 0715   HDL 47 05/27/2019 0931   CHOLHDL 4 02/28/2020 0715   VLDL 22.2 02/28/2020 0715   LDLCALC 142 (H) 02/28/2020 0715   LDLCALC 125 (H) 05/27/2019 0931    Physical Exam:    VS:  BP 132/86   Pulse (!) 58   Ht 6\' 3"  (1.905 m)   Wt 180 lb 3.2 oz (81.7 kg)   SpO2 97%   BMI 22.52 kg/m     Wt Readings from Last 3 Encounters:  05/25/20 180 lb 3.2 oz (81.7 kg)  01/20/20 193 lb (87.5 kg)  11/25/19 187 lb (84.8 kg)     GEN: Patient is in no acute distress HEENT: Normal NECK: No JVD; No carotid bruits LYMPHATICS: No lymphadenopathy CARDIAC: Hear sounds regular, 2/6 systolic murmur at the apex. RESPIRATORY:  Clear to auscultation  without rales, wheezing or rhonchi  ABDOMEN: Soft, non-tender, non-distended MUSCULOSKELETAL:  No edema; No deformity  SKIN: Warm and dry NEUROLOGIC:  Alert and oriented x 3 PSYCHIATRIC:  Normal affect   Signed, Jenean Lindau, MD  05/25/2020 4:28 PM    [facility]

## 2020-05-29 ENCOUNTER — Ambulatory Visit (INDEPENDENT_AMBULATORY_CARE_PROVIDER_SITE_OTHER): Payer: Medicare Other

## 2020-05-29 ENCOUNTER — Other Ambulatory Visit: Payer: Self-pay

## 2020-05-29 DIAGNOSIS — I472 Ventricular tachycardia, unspecified: Secondary | ICD-10-CM

## 2020-05-29 DIAGNOSIS — I4729 Other ventricular tachycardia: Secondary | ICD-10-CM

## 2020-05-29 DIAGNOSIS — I471 Supraventricular tachycardia: Secondary | ICD-10-CM | POA: Diagnosis not present

## 2020-06-19 ENCOUNTER — Ambulatory Visit (INDEPENDENT_AMBULATORY_CARE_PROVIDER_SITE_OTHER): Payer: Medicare Other | Admitting: Family Medicine

## 2020-06-19 ENCOUNTER — Other Ambulatory Visit: Payer: Self-pay | Admitting: Family Medicine

## 2020-06-19 ENCOUNTER — Other Ambulatory Visit: Payer: Self-pay

## 2020-06-19 ENCOUNTER — Encounter: Payer: Self-pay | Admitting: Family Medicine

## 2020-06-19 VITALS — BP 122/70 | HR 48 | Temp 97.6°F | Ht 75.0 in | Wt 175.4 lb

## 2020-06-19 DIAGNOSIS — I10 Essential (primary) hypertension: Secondary | ICD-10-CM

## 2020-06-19 DIAGNOSIS — J4530 Mild persistent asthma, uncomplicated: Secondary | ICD-10-CM

## 2020-06-19 DIAGNOSIS — E785 Hyperlipidemia, unspecified: Secondary | ICD-10-CM

## 2020-06-19 DIAGNOSIS — R972 Elevated prostate specific antigen [PSA]: Secondary | ICD-10-CM | POA: Diagnosis not present

## 2020-06-19 DIAGNOSIS — R634 Abnormal weight loss: Secondary | ICD-10-CM

## 2020-06-19 DIAGNOSIS — R7989 Other specified abnormal findings of blood chemistry: Secondary | ICD-10-CM

## 2020-06-19 HISTORY — DX: Mild persistent asthma, uncomplicated: J45.30

## 2020-06-19 LAB — COMPREHENSIVE METABOLIC PANEL
ALT: 9 U/L (ref 0–53)
AST: 13 U/L (ref 0–37)
Albumin: 4.1 g/dL (ref 3.5–5.2)
Alkaline Phosphatase: 65 U/L (ref 39–117)
BUN: 17 mg/dL (ref 6–23)
CO2: 28 mEq/L (ref 19–32)
Calcium: 9.4 mg/dL (ref 8.4–10.5)
Chloride: 108 mEq/L (ref 96–112)
Creatinine, Ser: 1.18 mg/dL (ref 0.40–1.50)
GFR: 67.29 mL/min (ref >60.00)
Glucose, Bld: 92 mg/dL (ref 70–99)
Potassium: 3.8 mEq/L (ref 3.5–5.1)
Sodium: 143 mEq/L (ref 135–145)
Total Bilirubin: 1 mg/dL (ref 0.2–1.2)
Total Protein: 7 g/dL (ref 6.0–8.3)

## 2020-06-19 LAB — CBC
HCT: 43.5 % (ref 39.0–52.0)
Hemoglobin: 14.5 g/dL (ref 13.0–17.0)
MCHC: 33.4 g/dL (ref 30.0–36.0)
MCV: 95.4 fl (ref 78.0–100.0)
Platelets: 146 10*3/uL — ABNORMAL LOW (ref 150.0–400.0)
RBC: 4.56 Mil/uL (ref 4.22–5.81)
RDW: 13.4 % (ref 11.5–15.5)
WBC: 4.7 10*3/uL (ref 4.0–10.5)

## 2020-06-19 LAB — LIPID PANEL
Cholesterol: 192 mg/dL (ref 0–200)
HDL: 47.4 mg/dL (ref >39.00)
LDL Cholesterol: 124 mg/dL — ABNORMAL HIGH (ref 0–99)
NonHDL: 144.41
Total CHOL/HDL Ratio: 4
Triglycerides: 100 mg/dL (ref 0.0–149.0)
VLDL: 20 mg/dL (ref 0.0–40.0)

## 2020-06-19 LAB — PSA: PSA: 2.73 ng/mL (ref 0.10–4.00)

## 2020-06-19 LAB — TSH: TSH: 1.34 u[IU]/mL (ref 0.35–4.50)

## 2020-06-19 MED ORDER — ALBUTEROL SULFATE HFA 108 (90 BASE) MCG/ACT IN AERS: 1.0000 | INHALATION_SPRAY | Freq: Four times a day (QID) | RESPIRATORY_TRACT | 3 refills | Status: DC | PRN

## 2020-06-19 MED ORDER — MONTELUKAST SODIUM 10 MG PO TABS: 10.0000 mg | ORAL_TABLET | Freq: Every day | ORAL | 2 refills | Status: DC

## 2020-06-19 MED ORDER — TRAMADOL HCL 50 MG PO TABS: ORAL_TABLET | ORAL | 1 refills | Status: DC

## 2020-06-19 MED ORDER — LISINOPRIL 10 MG PO TABS: 10.0000 mg | ORAL_TABLET | Freq: Every day | ORAL | 2 refills | Status: DC

## 2020-06-19 MED ORDER — ROSUVASTATIN CALCIUM 10 MG PO TABS: 10.0000 mg | ORAL_TABLET | Freq: Every day | ORAL | 3 refills | Status: DC

## 2020-06-19 NOTE — Patient Instructions (Signed)
Give us 2-3 business days to get the results of your labs back.   Keep the diet clean and stay active.  Let us know if you need anything. 

## 2020-06-19 NOTE — Progress Notes (Signed)
Chief Complaint  Patient presents with  . Follow-up    Subjective Justin Frazier is a 60 y.o. male who presents for hypertension follow up. He does monitor home blood pressures. Blood pressures ranging from 120's/80's on average. He is compliant with medication- lisinopril 10 mg/d. Patient has these side effects of medication: none He is adhering to a healthy diet overall. Current exercise: walking No CP or SOB.   Asthma Mild persistent. Takes Singulair 10 mg/d. Compliant, no AE's, well controlled. Uses albuterol <2x/week on average. No hospitalizations or steroid use.   Hyperlipidemia Patient presents for dyslipidemia follow up. Currently being treated with nothing He has been eating low calorie options out of fear of worsening LDL levels. Diet/exercise as above.  The patient is not known to have coexisting coronary artery disease.   Past Medical History:  Diagnosis Date  . Abnormal EKG 03/20/2017  . Adhesive capsulitis of left shoulder 07/26/2019  . Allergic rhinitis   . Apical variant hypertrophic cardiomyopathy ([place]) 05/27/2019  . Benign essential hypertension 05/22/2020  . Carpal tunnel syndrome   . Central serous retinopathy   . Enlarged lymph node 01/16/2019  . Glaucoma   . Hyperlipidemia 01/20/2020  . LVH (left ventricular hypertrophy) 03/20/2017  . Mild intermittent asthma without complication 2/95/1884  . 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 Concern over right parotid mass. See HPI. Examination reveals a mobile 1 cm mass in the immediate right preauricular area.  No surrounding adenopathy.  Head and neck is otherwise negative for nodes. PLAN: We discussed most tumors of the parotid gland are benign.  Needle biopsy is helpful to determine the nature.  Marland Kitchen NSVT (nonsustained ventricular tachycardia) ([place])    Seen by Dr. Mina Marble (EP) recommended beta blocker therapy (2014)  . Proteinuria   .  Renal cyst, left 03/08/2018  . Thrombocytopenia ([place]) 02/01/2018  . Tinea unguium 05/22/2020    Exam BP 122/70 (BP Location: Left Arm, Patient Position: Sitting, Cuff Size: Normal)   Pulse (!) 48   Temp 97.6 F (36.4 C) (Oral)   Ht 6\' 3"  (1.905 m)   Wt 175 lb 6 oz (79.5 kg)   SpO2 99%   BMI 21.92 kg/m  General:  well developed, well nourished, in no apparent distress Heart: RRR, no bruits, no LE edema Lungs: clear to auscultation, no accessory muscle use Psych: well oriented with normal range of affect and appropriate judgment/insight  Benign essential hypertension - Plan: lisinopril (ZESTRIL) 10 MG tablet  Hyperlipidemia, unspecified hyperlipidemia type - Plan: Comprehensive metabolic panel, Lipid panel  Unintentional weight loss of more than 10 pounds - Plan: CBC, TSH  Mild persistent asthma without complication - Plan: albuterol (VENTOLIN HFA) 108 (90 Base) MCG/ACT inhaler, montelukast (SINGULAIR) 10 MG tablet  Mild intermittent asthma without complication  Increased prostate specific antigen (PSA) velocity - Plan: PSA  1. Controlled. Cont lisinopril 10 mg/d. Counseled on diet and exercise. 2. Ck labs. I will place him on statin pending results.  3. This is happening again. Saw heme/onc a few years ago, unremarkable workup. I think he is calorie-restricting himself too much for fear of worsening #2.  4. Cont Singulair 10 mg/d, SABA. 5. Follow up on increased velocity.  F/u in 6 mo for CPE or prn. The patient voiced understanding and agreement to the plan.  Bailey, DO 06/19/20  7:23 AM

## 2020-07-01 ENCOUNTER — Other Ambulatory Visit: Payer: Self-pay | Admitting: Family Medicine

## 2020-07-07 ENCOUNTER — Other Ambulatory Visit: Payer: Self-pay

## 2020-07-07 ENCOUNTER — Ambulatory Visit (INDEPENDENT_AMBULATORY_CARE_PROVIDER_SITE_OTHER): Payer: Medicare Other | Admitting: Cardiology

## 2020-07-07 VITALS — BP 132/92 | HR 56 | Ht 75.0 in | Wt 178.0 lb

## 2020-07-07 DIAGNOSIS — I421 Obstructive hypertrophic cardiomyopathy: Secondary | ICD-10-CM

## 2020-07-07 NOTE — Progress Notes (Signed)
Electrophysiology Office Note   Date:  07/07/2020   ID:  Justin Frazier, DOB 08/30/1960, MRN [id number]  PCP:  [name], DO  Cardiologist:  Revankar Primary Electrophysiologist:  Sherryl Valido Meredith Leeds, MD    No chief complaint on file.    History of Present Illness: Justin Frazier is a 60 y.o. male who is being seen today for evaluation of hypertrophic cardiomyopathy.  He is presenting for electrophysiology evaluation.  He has a history of CKD stage I, hypertension, nonsustained VT, and apical hypertrophic cardiomyopathy.  He wore a cardiac monitor that showed 2 runs of nonsustained VT, longest 12 beats.  He has not had any episodes of syncope or near syncope since last being seen.  He has no family history of sudden death.  Cardiac MRI was performed which shows a less than 5% burden of LGE and no LV apical aneurysm.  Today, denies symptoms of palpitations, chest pain, shortness of breath, orthopnea, PND, lower extremity edema, claudication, dizziness, presyncope, syncope, bleeding, or neurologic sequela. The patient is tolerating medications without difficulties.  Since being seen he has done well.  He has no chest pain or shortness of breath.  He is able to do all of his daily activities without restriction.  He has continued to exercise with walking.  He has not done much in the way of aerobic exercise.  Past Medical History:  Diagnosis Date  . Abnormal EKG 03/20/2017  . Adhesive capsulitis of left shoulder 07/26/2019  . Allergic rhinitis   . Apical variant hypertrophic cardiomyopathy ([place]) 05/27/2019  . Benign essential hypertension 05/22/2020  . Carpal tunnel syndrome   . Central serous retinopathy   . Enlarged lymph node 01/16/2019  . Glaucoma   . Hyperlipidemia 01/20/2020  . LVH (left ventricular hypertrophy) 03/20/2017  . Mild intermittent asthma without complication 05/23/8117  . 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 Concern over right parotid mass. See HPI. Examination reveals a mobile 1 cm mass in the immediate right preauricular area.  No surrounding adenopathy.  Head and neck is otherwise negative for nodes. PLAN: We discussed most tumors of the parotid gland are benign.  Needle biopsy is helpful to determine the nature.  Marland Kitchen NSVT (nonsustained ventricular tachycardia) ([place])    Seen by Dr. Mina Marble (EP) recommended beta blocker therapy (2014)  . Proteinuria   . Renal cyst, left 03/08/2018  . Thrombocytopenia ([place]) 02/01/2018  . Tinea unguium 05/22/2020   Past Surgical History:  Procedure Laterality Date  . COLONOSCOPY  06/11/2013     Current Outpatient Medications  Medication Sig Dispense Refill  . albuterol (PROAIR HFA) 108 (90 Base) MCG/ACT inhaler Inhale 1 puff into the lungs every 6 (six) hours as needed for wheezing or shortness of breath. 18 g 2  . aspirin EC 81 MG tablet Take 81 mg by mouth daily.     . Cholecalciferol 5000 units TABS Take 5,000 mg by mouth daily.    . dorzolamide-timolol (COSOPT) 22.3-6.8 MG/ML ophthalmic solution Place 1 drop into both eyes 2 (two) times daily.    Marland Kitchen lisinopril (ZESTRIL) 10 MG tablet Take 1 tablet (10 mg total) by mouth daily. 90 tablet 2  . metoprolol succinate (TOPROL-XL) 25 MG 24 hr tablet Take 25 mg by mouth daily.    . montelukast (SINGULAIR) 10 MG tablet Take 1 tablet (10 mg total) by mouth daily. 90 tablet 2  . RHOPRESSA  0.02 % SOLN Place 1 drop into both eyes daily.    . rosuvastatin (CRESTOR) 10 MG tablet TAKE 1 TABLET BY MOUTH EVERY DAY 90 tablet 1  . traMADol (ULTRAM) 50 MG tablet Take 2 tabs every 6 hours as needed for moderate-severe pain. Not to exceed 4 tabs daily. 90 tablet 1  . travoprost, benzalkonium, (TRAVATAN) 0.004 % ophthalmic solution Place 1 drop into both eyes at bedtime.    . vitamin B-12 (CYANOCOBALAMIN) 1000 MCG tablet Take 1,000 mcg by mouth daily.     No current facility-administered  medications for this visit.    Allergies:   Brinzolamide-brimonidine   Social History:  The patient  reports that he has never smoked. He has never used smokeless tobacco. He reports current drug use. He reports that he does not drink alcohol.   Family History:  The patient's family history includes Alzheimer's disease in his mother; Hypertension in his father; Liver cancer in his father.   ROS:  Please see the history of present illness.   Otherwise, review of systems is positive for none.   All other systems are reviewed and negative.   PHYSICAL EXAM: VS:  BP (!) 132/92   Pulse (!) 56   Ht 6\' 3"  (1.905 m)   Wt 178 lb (80.7 kg)   BMI 22.25 kg/m  , BMI Body mass index is 22.25 kg/m. GEN: Well nourished, well developed, in no acute distress  HEENT: normal  Neck: no JVD, carotid bruits, or masses Cardiac: RRR; no murmurs, rubs, or gallops,no edema  Respiratory:  clear to auscultation bilaterally, normal work of breathing GI: soft, nontender, nondistended, + BS MS: no deformity or atrophy  Skin: warm and dry Neuro:  Strength and sensation are intact Psych: euthymic mood, full affect  EKG:  EKG is not ordered today. Personal review of the ekg ordered 05/25/20 shows sinus rhythm, rate 58, LVH with repolarization abnormalities  Recent Labs: 06/19/2020: ALT 9; BUN 17; Creatinine, Ser 1.18; Hemoglobin 14.5; Platelets 146.0; Potassium 3.8; Sodium 143; TSH 1.34    Lipid Panel     Component Value Date/Time   CHOL 192 06/19/2020 0728   CHOL 184 05/27/2019 0931   TRIG 100.0 06/19/2020 0728   HDL 47.40 06/19/2020 0728   HDL 47 05/27/2019 0931   CHOLHDL 4 06/19/2020 0728   VLDL 20.0 06/19/2020 0728   LDLCALC 124 (H) 06/19/2020 0728   LDLCALC 125 (H) 05/27/2019 0931     Wt Readings from Last 3 Encounters:  07/07/20 178 lb (80.7 kg)  06/19/20 175 lb 6 oz (79.5 kg)  05/25/20 180 lb 3.2 oz (81.7 kg)      Other studies Reviewed: Additional studies/ records that were reviewed  today include: TTE 04/11/17  Review of the above records today demonstrates:  - Left ventricle: The cavity size was normal. There was mild   concentric hypertrophy with moderate apical hypertrophy measuring   1.5 cm. Systolic function was normal. The estimated ejection   fraction was in the range of 60% to 65%. Wall motion was normal;   there were no regional wall motion abnormalities. Features are   consistent with a pseudonormal left ventricular filling pattern,   with concomitant abnormal relaxation and increased filling   pressure (grade 2 diastolic dysfunction). Doppler parameters are   consistent with indeterminate ventricular filling pressure. - Aortic valve: Transvalvular velocity was within the normal range.   There was no stenosis. There was mild regurgitation. - Mitral valve: Transvalvular velocity was within the normal  range.   There was no evidence for stenosis. There was mild regurgitation. - Left atrium: The atrium was moderately to severely dilated. - Right ventricle: The cavity size was normal. Wall thickness was   normal. Systolic function was normal. - Atrial septum: No defect or patent foramen ovale was identified. - Tricuspid valve: There was trivial regurgitation.  SPECT 04/11/17  Nuclear stress EF: 47%.  There was no ST segment deviation noted during stress.  This is a low risk study.  The left ventricular ejection fraction is mildly decreased (45-54%).   Normal resting and stress perfusion. No ischemia or infarction EF Estimated 47% with apical dyskinesis Suggest echo correlation For apical HOCM given ECG appearance   Holter 04/19/17 - personally reviewed Baseline rhythm: Sinus Minimum heart rate: 40 BPM.  Average heart rate: 63 BPM.  Maximal heart rate 118 BPM. Symptoms: Patient had multiple episodes of fluttering sensation and this did not correlate with any significant arrhythmias  Conclusion:  Abnormal Holter monitoring.  Patient has baseline  abnormal EKG with deep inverted T waves.  Occasional PACs and 715 PVCs in the monitoring with one 4 beat NSVT  CMRI 06/13/17 1. Mildly dilated left ventricle with severely thickened apical segments (up to 19 mm) with a typical spade-shape appearance of the left ventricular cavity. Basal segments have mild hypertrophy. Normal systolic function (LVEF = 64%). There are no regional wall motion abnormalities. There is diffuse late gadolinium enhancement in all apical segments.  2. Normal right ventricular size, thickness and systolic function (LVEF = 53%). There are no regional wall motion abnormalities.  3.  Moderately dilated left atrium.  4.  Mild aortic and mitral regurgitation.  5. Normal pericardium. Trivial pericardial effusion located posteriorly to the heart.  Collectively, these findings are consistent with an apical form of hypertrophic cardiomyopathy with high risk feature - diffuse late gadolinium enhancement in all 4 apical segments.  ASSESSMENT AND PLAN:  1.  Apical hypertrophic cardiomyopathy: Cardiac MRI with a 61mm Apex with no evidence of apical aneurysm.  He has 2.4% LGE.  He also has nonsustained VT on cardiac monitoring, he had 2 episodes, longest 12 beats.  He is 60 years of age, and has not had sudden death to this date.  He also does not have a family history of sudden death.  He has not had syncope, and his ejection fraction is not end-stage for hypertrophic cardiomyopathy.  Due to that, we Ivery Nanney hold off on further therapy with an ICD.  The patient is in agreement with this.  2.  Hypertension: Currently well controlled  3.  Nonsustained VT: Asymptomatic  Case discussed with primary cardiology  Current medicines are reviewed at length with the patient today.   The patient does not have concerns regarding his medicines.  The following changes were made today: None  Labs/ tests ordered today include:  No orders of the defined types were placed in this  encounter.   Disposition:   FU with Mylan Schwarz as needed months  Signed, Imaan Padgett Meredith Leeds, MD  07/07/2020 4:11 PM     [address] [place] [address] [phone] (office) [phone] (fax)

## 2020-07-29 ENCOUNTER — Other Ambulatory Visit: Payer: Self-pay

## 2020-07-29 ENCOUNTER — Other Ambulatory Visit (INDEPENDENT_AMBULATORY_CARE_PROVIDER_SITE_OTHER): Payer: Medicare Other

## 2020-07-29 DIAGNOSIS — R7989 Other specified abnormal findings of blood chemistry: Secondary | ICD-10-CM

## 2020-07-29 DIAGNOSIS — E785 Hyperlipidemia, unspecified: Secondary | ICD-10-CM | POA: Diagnosis not present

## 2020-07-29 LAB — HEPATIC FUNCTION PANEL
ALT: 12 U/L (ref 0–53)
AST: 15 U/L (ref 0–37)
Albumin: 4.2 g/dL (ref 3.5–5.2)
Alkaline Phosphatase: 64 U/L (ref 39–117)
Bilirubin, Direct: 0.2 mg/dL (ref 0.0–0.3)
Total Bilirubin: 0.7 mg/dL (ref 0.2–1.2)
Total Protein: 6.8 g/dL (ref 6.0–8.3)

## 2020-07-29 LAB — LIPID PANEL
Cholesterol: 125 mg/dL (ref 0–200)
HDL: 49.4 mg/dL (ref >39.00)
LDL Cholesterol: 64 mg/dL (ref 0–99)
NonHDL: 75.54
Total CHOL/HDL Ratio: 3
Triglycerides: 56 mg/dL (ref 0.0–149.0)
VLDL: 11.2 mg/dL (ref 0.0–40.0)

## 2020-07-31 ENCOUNTER — Other Ambulatory Visit: Payer: Medicare Other

## 2020-10-09 ENCOUNTER — Ambulatory Visit (INDEPENDENT_AMBULATORY_CARE_PROVIDER_SITE_OTHER): Payer: Medicare Other

## 2020-10-09 VITALS — Ht 75.0 in | Wt 178.0 lb

## 2020-10-09 DIAGNOSIS — Z0000 Encounter for general adult medical examination without abnormal findings: Secondary | ICD-10-CM

## 2020-10-09 NOTE — Progress Notes (Signed)
Subjective:   Justin Frazier is a 60 y.o. male who presents for an Initial Medicare Annual Wellness Visit.  I connected with Amner today by telephone and verified that I am speaking with the correct person using two identifiers. Location patient: home Location provider: work Persons participating in the virtual visit: patient, Marine scientist.    I discussed the limitations, risks, security and privacy concerns of performing an evaluation and management service by telephone and the availability of in person appointments. I also discussed with the patient that there may be a patient responsible charge related to this service. The patient expressed understanding and verbally consented to this telephonic visit.    Interactive audio and video telecommunications were attempted between this provider and patient, however failed, due to patient having technical difficulties OR patient did not have access to video capability.  We continued and completed visit with audio only.  Some vital signs may be absent or patient reported.   Time Spent with patient on telephone encounter: 30 minutes   Review of Systems     Cardiac Risk Factors include: advanced age (>20mn, >>38women);male gender;diabetes mellitus;hypertension     Objective:    Today's Vitals   10/09/20 0822  Weight: 178 lb (80.7 kg)  Height: '6\' 3"'$  (1.905 m)   Body mass index is 22.25 kg/m.  Advanced Directives 10/09/2020 07/27/2018 03/08/2018 02/01/2018  Does Patient Have a Medical Advance Directive? Yes No No No  Type of [place]amed[place] will - - -  Copy of HResacain Chart? No - copy requested - - -  Would patient like information on creating a medical advance directive? - No - Patient declined No - Patient declined No - Patient declined    Current Medications (verified) Outpatient Encounter Medications as of 10/09/2020  Medication Sig   albuterol (PROAIR HFA) 108 (90 Base)  MCG/ACT inhaler Inhale 1 puff into the lungs every 6 (six) hours as needed for wheezing or shortness of breath.   aspirin EC 81 MG tablet Take 81 mg by mouth daily.    Cholecalciferol 5000 units TABS Take 5,000 mg by mouth daily.   dorzolamide-timolol (COSOPT) 22.3-6.8 MG/ML ophthalmic solution Place 1 drop into both eyes 2 (two) times daily.   lisinopril (ZESTRIL) 10 MG tablet Take 1 tablet (10 mg total) by mouth daily.   metoprolol succinate (TOPROL-XL) 25 MG 24 hr tablet Take 25 mg by mouth daily.   montelukast (SINGULAIR) 10 MG tablet Take 1 tablet (10 mg total) by mouth daily.   RHOPRESSA 0.02 % SOLN Place 1 drop into both eyes daily.   rosuvastatin (CRESTOR) 10 MG tablet TAKE 1 TABLET BY MOUTH EVERY DAY   traMADol (ULTRAM) 50 MG tablet Take 2 tabs every 6 hours as needed for moderate-severe pain. Not to exceed 4 tabs daily.   travoprost, benzalkonium, (TRAVATAN) 0.004 % ophthalmic solution Place 1 drop into both eyes at bedtime.   vitamin B-12 (CYANOCOBALAMIN) 1000 MCG tablet Take 1,000 mcg by mouth daily.   No facility-administered encounter medications on file as of 10/09/2020.    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 Concern over right parotid mass. See HPI. Examination reveals a mobile 1 cm mass in the immediate right preauricular area.  No surrounding adenopathy.  Head and  neck is otherwise negative for nodes. PLAN: We discussed most tumors of the parotid gland are benign.  Needle biopsy is helpful to determine the nature.   NSVT (nonsustained ventricular tachycardia) ([facility])    Seen by Dr. Mina Marble (EP) recommended beta blocker therapy (2014)   Proteinuria    Renal cyst, left 03/08/2018   Thrombocytopenia ([place]) 02/01/2018   Tinea unguium 05/22/2020   Past Surgical History:  Procedure Laterality Date   COLONOSCOPY  06/11/2013   Family History  Problem Relation Age of Onset   Alzheimer's disease Mother    Hypertension Father    Liver cancer Father    Social History   Socioeconomic History   Marital status: Married    Spouse name: Not on file   Number of children: Not on file   Years of education: Not on file   Highest education level: Not on file  Occupational History   Not on file  Tobacco Use   Smoking status: Never   Smokeless tobacco: Never  Vaping Use   Vaping Use: Never used  Substance and Sexual Activity   Alcohol use: No   Drug use: Yes   Sexual activity: Not Currently  Other Topics Concern   Not on file  Social History Narrative   Not on file   Social Determinants of Health   Financial Resource Strain: Low Risk    Difficulty of Paying Living Expenses: Not hard at all  Food Insecurity: No Food Insecurity   Worried About Charity fundraiser in the Last Year: Never true   [place] in the Last Year: Never true  Transportation Needs: No Transportation Needs   Lack of Transportation (Medical): No   Lack of Transportation (Non-Medical): No  Physical Activity: Insufficiently Active   Days of Exercise per Week: 2 days   Minutes of Exercise per Session: 30 min  Stress: No Stress Concern Present   Feeling of Stress : Not at all  Social Connections: Moderately Integrated   Frequency of Communication with Friends and Family: More than three times a week   Frequency of Social Gatherings with Friends and Family: More than three times a  week   Attends Religious Services: More than 4 times per year   Active Member of [employer] or Organizations: No   Attends Music therapist: Never   Marital Status: Married    Tobacco Counseling Counseling given: Not Answered   Clinical Intake:  Pre-visit preparation completed: Yes  Pain : No/denies pain     Nutritional Status: BMI of 19-24  Normal Nutritional Risks: Unintentional weight loss Diabetes: No  How often do you need to have someone help you when you read instructions, pamphlets, or other written materials from your doctor or pharmacy?: 1 - Never  Diabetic?No  Interpreter Needed?: No  Information entered by :: Caroleen Hamman LPN   Activities of Daily Living In your present state of health, do you have any difficulty performing the following activities: 10/09/2020  Hearing? N  Vision? N  Difficulty concentrating or making decisions? N  Walking or climbing stairs? N  Dressing or bathing? N  Doing errands, shopping? N  Preparing Food and eating ? N  Using the Toilet? N  In the past six months, have you accidently leaked urine? N  Do you have problems with loss of bowel control? N  Managing your Medications? N  Managing your Finances? N  Housekeeping or managing your Housekeeping? N  Some recent data might be hidden    Patient Care Team: [name], DO as PCP - General (Family Medicine) Constance Haw, MD as PCP - Electrophysiology (Cardiology)  Indicate any recent Medical Services you may have received from other than [facility] providers in the past year (date may be approximate).     Assessment:   This is a routine wellness examination for Jacquelyn.  Hearing/Vision screen Hearing Screening - Comments:: No hearing loss but does have ringing in ears Vision Screening - Comments:: Last eye exam-2022  Dietary issues and exercise activities discussed: Current Exercise Habits: Home exercise routine, Type of exercise: walking, Time  (Minutes): 30, Frequency (Times/Week): 3, Weekly Exercise (Minutes/Week): 90, Intensity: Mild, Exercise limited by: None identified   Goals Addressed             This Visit's Progress    Patient Stated       Would like to gain back some weight       Depression Screen PHQ 2/9 Scores 10/09/2020 06/19/2020  PHQ - 2 Score 0 0    Fall Risk Fall Risk  10/09/2020 06/19/2020  Falls in the past year? 0 0  Number falls in past yr: 0 0  Injury with Fall? 0 0  Risk for fall due to : - No Fall Risks  Follow up Falls prevention discussed Falls evaluation completed    [place]:  Any stairs in or around the home? Yes  If so, are there any without handrails? No  Home free of loose throw rugs in walkways, pet beds, electrical cords, etc? Yes  Adequate lighting in your home to reduce risk of falls? Yes   ASSISTIVE DEVICES UTILIZED TO PREVENT FALLS:  Life alert? No  Use of a cane, walker or w/c? No  Grab bars in the bathroom? No  Shower chair or bench in shower? Yes  Elevated toilet seat or a handicapped toilet? No   TIMED UP AND GO:  Was the test performed? No . Phone visit   Cognitive Function:Normal cognitive status assessed by  this Nurse Health Advisor. No abnormalities found.          Immunizations Immunization History  Administered Date(s) Administered   Influenza Inj Mdck Quad Pf 11/24/2016   Moderna Sars-Covid-2 Vaccination 06/04/2019, 07/04/2019   Tdap 01/20/2020    TDAP status: Up to date  Flu Vaccine status: Due, Education has been provided regarding the importance of this vaccine. Advised may receive this vaccine at local pharmacy or Health Dept. Aware to provide a copy of the vaccination record if obtained from local pharmacy or Health Dept. Verbalized acceptance and understanding.  Pneumococcal vaccine status: Due, Education has been provided regarding the importance of this vaccine. Advised may receive this vaccine at local  pharmacy or Health Dept. Aware to provide a copy of the vaccination record if obtained from local pharmacy or Health Dept. Verbalized acceptance and understanding.  Covid-19 vaccine status: Information provided on how to obtain vaccines. Booster due  Qualifies for Shingles Vaccine? Yes   Zostavax completed No   Shingrix Completed?: No.    Education  has been provided regarding the importance of this vaccine. Patient has been advised to call insurance company to determine out of pocket expense if they have not yet received this vaccine. Advised may also receive vaccine at local pharmacy or Health Dept. Verbalized acceptance and understanding.  Screening Tests Health Maintenance  Topic Date Due   Pneumococcal Vaccine 51-40 Years old (1 - PCV) Never done   Zoster Vaccines- Shingrix (1 of 2) Never done   COVID-19 Vaccine (3 - Booster for Moderna series) 12/04/2019   INFLUENZA VACCINE  09/14/2020   COLONOSCOPY (Pts 45-108yr Insurance coverage will need to be confirmed)  07/19/2024   TETANUS/TDAP  01/19/2030   Hepatitis C Screening  Completed   HIV Screening  Completed   HPV VACCINES  Aged Out    Health Maintenance  Health Maintenance Due  Topic Date Due   Pneumococcal Vaccine 027644Years old (1 - PCV) Never done   Zoster Vaccines- Shingrix (1 of 2) Never done   COVID-19 Vaccine (3 - Booster for Moderna series) 12/04/2019   INFLUENZA VACCINE  09/14/2020    Colorectal cancer screening: Type of screening: Colonoscopy. Completed 07/20/2014. Repeat every 10 years. No report. Patient is unsure when he is suppose to repeat. [place]  Lung Cancer Screening: (Low Dose CT Chest recommended if Age 60-80years, 30 pack-year currently smoking OR have quit w/in 15years.) does not qualify.    Additional Screening:  Hepatitis C Screening: Completed 02/01/2018  Vision Screening: Recommended annual ophthalmology exams for early detection of glaucoma and  other disorders of the eye. Is the patient up to date with their annual eye exam?  Yes  Who is the provider or what is the name of the office in which the patient attends annual eye exams? Patient unsure of name   Dental Screening: Recommended annual dental exams for proper oral hygiene  Community Resource Referral / Chronic Care Management: CRR required this visit?  No   CCM required this visit?  No      Plan:     I have personally reviewed and noted the following in the patient's chart:   Medical and social history Use of alcohol, tobacco or illicit drugs  Current medications and supplements including opioid prescriptions. Patient is not currently taking opioid prescriptions. Functional ability and status Nutritional status Physical activity Advanced directives List of other physicians Hospitalizations, surgeries, and ER visits in previous 12 months Vitals Screenings to include cognitive, depression, and falls Referrals and appointments  In addition, I have reviewed and discussed with patient certain preventive protocols, quality metrics, and best practice recommendations. A written personalized care plan for preventive services as well as general preventive health recommendations were provided to patient.   Due to this being a telephonic visit, the after visit summary with patients personalized plan was offered to patient via mail or my-chart.  Patient would like to access on my-chart.   MMarta Antu LPN   [id number] Nurse Health Advisor  Nurse Notes: None

## 2020-10-09 NOTE — Patient Instructions (Signed)
Mr. Justin Frazier , Thank you for taking time to complete your Medicare Wellness Visit. I appreciate your ongoing commitment to your health goals. Please review the following plan we discussed and let me know if I can assist you in the future.   Screening recommendations/referrals: Colonoscopy: Completed 07/20/2014-Per our conversation, I will attempt to locate the actual report. Recommended yearly ophthalmology/optometry visit for glaucoma screening and checkup Recommended yearly dental visit for hygiene and checkup  Vaccinations: Influenza vaccine: Due 10/2020 Pneumococcal vaccine: Discuss with Dr. Nani Ravens Tdap vaccine: Up to date-Due 01/2030 Shingles vaccine: Discuss with pharmacy   Covid-19: Booster due  Advanced directives: Please bring a copy for your chart  Conditions/risks identified: See problem list  Next appointment: Follow up in one year for your annual wellness visit   Preventive Care 40-64 Years, Male Preventive care refers to lifestyle choices and visits with your health care provider that can promote health and wellness. What does preventive care include? A yearly physical exam. This is also called an annual well check. Dental exams once or twice a year. Routine eye exams. Ask your health care provider how often you should have your eyes checked. Personal lifestyle choices, including: Daily care of your teeth and gums. Regular physical activity. Eating a healthy diet. Avoiding tobacco and drug use. Limiting alcohol use. Practicing safe sex. Taking low-dose aspirin every day starting at age 52. What happens during an annual well check? The services and screenings done by your health care provider during your annual well check will depend on your age, overall health, lifestyle risk factors, and family history of disease. Counseling  Your health care provider may ask you questions about your: Alcohol use. Tobacco use. Drug use. Emotional well-being. Home and  relationship well-being. Sexual activity. Eating habits. Work and work Statistician. Screening  You may have the following tests or measurements: Height, weight, and BMI. Blood pressure. Lipid and cholesterol levels. These may be checked every 5 years, or more frequently if you are over 63 years old. Skin check. Lung cancer screening. You may have this screening every year starting at age 30 if you have a 30-pack-year history of smoking and currently smoke or have quit within the past 15 years. Fecal occult blood test (FOBT) of the stool. You may have this test every year starting at age 21. Flexible sigmoidoscopy or colonoscopy. You may have a sigmoidoscopy every 5 years or a colonoscopy every 10 years starting at age 90. Prostate cancer screening. Recommendations will vary depending on your family history and other risks. Hepatitis C blood test. Hepatitis B blood test. Sexually transmitted disease (STD) testing. Diabetes screening. This is done by checking your blood sugar (glucose) after you have not eaten for a while (fasting). You may have this done every 1-3 years. Discuss your test results, treatment options, and if necessary, the need for more tests with your health care provider. Vaccines  Your health care provider may recommend certain vaccines, such as: Influenza vaccine. This is recommended every year. Tetanus, diphtheria, and acellular pertussis (Tdap, Td) vaccine. You may need a Td booster every 10 years. Zoster vaccine. You may need this after age 84. Pneumococcal 13-valent conjugate (PCV13) vaccine. You may need this if you have certain conditions and have not been vaccinated. Pneumococcal polysaccharide (PPSV23) vaccine. You may need one or two doses if you smoke cigarettes or if you have certain conditions. Talk to your health care provider about which screenings and vaccines you need and how often you need them. This information  is not intended to replace advice given to  you by your health care provider. Make sure you discuss any questions you have with your health care provider. Document Released: 02/27/2015 Document Revised: 10/21/2015 Document Reviewed: 12/02/2014 Elsevier Interactive Patient Education  2017 [place] Prevention in the Home Falls can cause injuries. They can happen to people of all ages. There are many things you can do to make your home safe and to help prevent falls. What can I do on the outside of my home? Regularly fix the edges of walkways and driveways and fix any cracks. Remove anything that might make you trip as you walk through a door, such as a raised step or threshold. Trim any bushes or trees on the path to your home. Use bright outdoor lighting. Clear any walking paths of anything that might make someone trip, such as rocks or tools. Regularly check to see if handrails are loose or broken. Make sure that both sides of any steps have handrails. Any raised decks and porches should have guardrails on the edges. Have any leaves, snow, or ice cleared regularly. Use sand or salt on walking paths during winter. Clean up any spills in your garage right away. This includes oil or grease spills. What can I do in the bathroom? Use night lights. Install grab bars by the toilet and in the tub and shower. Do not use towel bars as grab bars. Use non-skid mats or decals in the tub or shower. If you need to sit down in the shower, use a plastic, non-slip stool. Keep the floor dry. Clean up any water that spills on the floor as soon as it happens. Remove soap buildup in the tub or shower regularly. Attach bath mats securely with double-sided non-slip rug tape. Do not have throw rugs and other things on the floor that can make you trip. What can I do in the bedroom? Use night lights. Make sure that you have a light by your bed that is easy to reach. Do not use any sheets or blankets that are too big for your bed. They should not  hang down onto the floor. Have a firm chair that has side arms. You can use this for support while you get dressed. Do not have throw rugs and other things on the floor that can make you trip. What can I do in the kitchen? Clean up any spills right away. Avoid walking on wet floors. Keep items that you use a lot in easy-to-reach places. If you need to reach something above you, use a strong step stool that has a grab bar. Keep electrical cords out of the way. Do not use floor polish or wax that makes floors slippery. If you must use wax, use non-skid floor wax. Do not have throw rugs and other things on the floor that can make you trip. What can I do with my stairs? Do not leave any items on the stairs. Make sure that there are handrails on both sides of the stairs and use them. Fix handrails that are broken or loose. Make sure that handrails are as long as the stairways. Check any carpeting to make sure that it is firmly attached to the stairs. Fix any carpet that is loose or worn. Avoid having throw rugs at the top or bottom of the stairs. If you do have throw rugs, attach them to the floor with carpet tape. Make sure that you have a light switch at the top of  the stairs and the bottom of the stairs. If you do not have them, ask someone to add them for you. What else can I do to help prevent falls? Wear shoes that: Do not have high heels. Have rubber bottoms. Are comfortable and fit you well. Are closed at the toe. Do not wear sandals. If you use a stepladder: Make sure that it is fully opened. Do not climb a closed stepladder. Make sure that both sides of the stepladder are locked into place. Ask someone to hold it for you, if possible. Clearly mark and make sure that you can see: Any grab bars or handrails. First and last steps. Where the edge of each step is. Use tools that help you move around (mobility aids) if they are needed. These  include: Canes. Walkers. Scooters. Crutches. Turn on the lights when you go into a dark area. Replace any light bulbs as soon as they burn out. Set up your furniture so you have a clear path. Avoid moving your furniture around. If any of your floors are uneven, fix them. If there are any pets around you, be aware of where they are. Review your medicines with your doctor. Some medicines can make you feel dizzy. This can increase your chance of falling. Ask your doctor what other things that you can do to help prevent falls. This information is not intended to replace advice given to you by your health care provider. Make sure you discuss any questions you have with your health care provider. Document Released: 11/27/2008 Document Revised: 07/09/2015 Document Reviewed: 03/07/2014 Elsevier Interactive Patient Education  2017 [employer].

## 2020-10-20 ENCOUNTER — Encounter: Payer: Self-pay | Admitting: Family Medicine

## 2020-10-20 ENCOUNTER — Ambulatory Visit (INDEPENDENT_AMBULATORY_CARE_PROVIDER_SITE_OTHER): Payer: Medicare Other | Admitting: Family Medicine

## 2020-10-20 ENCOUNTER — Other Ambulatory Visit: Payer: Self-pay

## 2020-10-20 VITALS — BP 118/79 | HR 57 | Temp 97.0°F | Ht 75.0 in | Wt 185.0 lb

## 2020-10-20 DIAGNOSIS — Z711 Person with feared health complaint in whom no diagnosis is made: Secondary | ICD-10-CM | POA: Diagnosis not present

## 2020-10-20 NOTE — Patient Instructions (Addendum)
Consider lifting weights.   Your weight is normal.  You don't need to eat beef.  Consider going to [email or web address] for nutrition and lifting ideas.   Let us know if you need anything.

## 2020-10-20 NOTE — Progress Notes (Signed)
Chief Complaint  Patient presents with   Weight Loss    Subjective: Patient is a 60 y.o. male here for weight discussion.  Patient has been concerned about his weight for the past 2 years.  He feels that he is too skinny.  He cannot tolerate beef because it upsets his stomach.  He does not eat as much as he used to.  He feels best 190 pounds and he weighs around 180 to 185 pounds routinely.  He was worked up for this in the past that was unremarkable.  He is up-to-date with his cancer screenings.  He has seen hematology in the past for thrombocytopenia.  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 Concern over right parotid mass. See HPI. Examination reveals a mobile 1 cm mass in the immediate right preauricular area.  No surrounding adenopathy.  Head and neck is otherwise negative for nodes. PLAN: We discussed most tumors of the parotid gland are benign.  Needle biopsy is helpful to determine the nature.   NSVT (nonsustained ventricular tachycardia) ([place])    Seen by Dr. Mina Marble (EP) recommended beta blocker therapy (2014)   Proteinuria    Renal cyst, left 03/08/2018   Thrombocytopenia ([facility]) 02/01/2018   Tinea unguium 05/22/2020    Objective: BP 118/79   Pulse (!) 57   Temp (!) 97 F (36.1 C) (Oral)   Ht '6\' 3"'$  (1.905 m)   Wt 185 lb (83.9 kg)   SpO2 97%   BMI 23.12 kg/m  General: Awake, appears stated age Lungs: No accessory muscle  use Psych: Age appropriate judgment and insight, normal affect and mood  Assessment and Plan: Worried well  His weight is fine.  I reassured him of this and also relayed that his preoperative major cancer screenings and laboratory work-up is also reassuring.  I did recommend lifting weights and increasing protein intake.  Recommended going to [email or web address] for nutritional and weight resistance recommendations for his goal. The patient voiced understanding and agreement to the plan.  I spent around 25 minutes with the patient discussing the above plan and reviewing his chart and sending to visit.  [place], DO 10/20/20  3:08 PM

## 2020-11-26 ENCOUNTER — Other Ambulatory Visit: Payer: Self-pay | Admitting: Family Medicine

## 2020-11-26 DIAGNOSIS — J4530 Mild persistent asthma, uncomplicated: Secondary | ICD-10-CM

## 2020-12-22 ENCOUNTER — Other Ambulatory Visit: Payer: Self-pay

## 2020-12-22 DIAGNOSIS — H401133 Primary open-angle glaucoma, bilateral, severe stage: Secondary | ICD-10-CM

## 2020-12-22 DIAGNOSIS — H5210 Myopia, unspecified eye: Secondary | ICD-10-CM

## 2020-12-22 DIAGNOSIS — H401130 Primary open-angle glaucoma, bilateral, stage unspecified: Secondary | ICD-10-CM

## 2020-12-22 DIAGNOSIS — H401123 Primary open-angle glaucoma, left eye, severe stage: Secondary | ICD-10-CM

## 2020-12-22 DIAGNOSIS — H04129 Dry eye syndrome of unspecified lacrimal gland: Secondary | ICD-10-CM | POA: Insufficient documentation

## 2020-12-22 HISTORY — DX: Dry eye syndrome of unspecified lacrimal gland: H04.129

## 2020-12-22 HISTORY — DX: Primary open-angle glaucoma, left eye, severe stage: H40.1123

## 2020-12-22 HISTORY — DX: Primary open-angle glaucoma, bilateral, stage unspecified: H40.1130

## 2020-12-22 HISTORY — DX: Primary open-angle glaucoma, bilateral, severe stage: H40.1133

## 2020-12-22 HISTORY — DX: Myopia, unspecified eye: H52.10

## 2020-12-24 ENCOUNTER — Other Ambulatory Visit: Payer: Self-pay

## 2020-12-24 ENCOUNTER — Encounter: Payer: Self-pay | Admitting: Cardiology

## 2020-12-24 ENCOUNTER — Ambulatory Visit (INDEPENDENT_AMBULATORY_CARE_PROVIDER_SITE_OTHER): Payer: Medicare Other | Admitting: Cardiology

## 2020-12-24 VITALS — BP 120/80 | HR 88 | Ht 75.0 in | Wt 190.8 lb

## 2020-12-24 DIAGNOSIS — I4729 Other ventricular tachycardia: Secondary | ICD-10-CM

## 2020-12-24 DIAGNOSIS — I422 Other hypertrophic cardiomyopathy: Secondary | ICD-10-CM | POA: Diagnosis not present

## 2020-12-24 DIAGNOSIS — I10 Essential (primary) hypertension: Secondary | ICD-10-CM

## 2020-12-24 DIAGNOSIS — E782 Mixed hyperlipidemia: Secondary | ICD-10-CM | POA: Diagnosis not present

## 2020-12-24 MED ORDER — ROSUVASTATIN CALCIUM 10 MG PO TABS: 10.0000 mg | ORAL_TABLET | Freq: Every day | ORAL | 3 refills | Status: DC

## 2020-12-24 NOTE — Patient Instructions (Signed)
Medication Instructions:  Your physician recommends that you continue on your current medications as directed. Please refer to the Current Medication list given to you today.  *If you need a refill on your cardiac medications before your next appointment, please call your pharmacy*   Lab Work: None ordered If you have labs (blood work) drawn today and your tests are completely normal, you will receive your results only by: [place] (if you have MyChart) OR A paper copy in the mail If you have any lab test that is abnormal or we need to change your treatment, we will call you to review the results.   Testing/Procedures:  Stress Echocardiogram Information Sheet Instructions:  This test will be done at [facility].  Nothing to eat or drink after midnight.  Dress prepared to exercise.  Please bring all current prescription medications.   Follow-Up: At [facility], you and your health needs are our priority.  As part of our continuing mission to provide you with exceptional heart care, we have created designated Provider Care Teams.  These Care Teams include your primary Cardiologist (physician) and Advanced Practice Providers (APPs -  Physician Assistants and Nurse Practitioners) who all work together to provide you with the care you need, when you need it.  We recommend signing up for the patient portal called "MyChart".  Sign up information is provided on this After Visit Summary.  MyChart is used to connect with patients for Virtual Visits (Telemedicine).  Patients are able to view lab/test results, encounter notes, upcoming appointments, etc.  Non-urgent messages can be sent to your provider as well.   To learn more about what you can do with MyChart, go to [email or web address].    Your next appointment:   6 month(s)  The format for your next appointment:   In Person  Provider:   Jyl Heinz, MD   Other Instructions Exercise Stress Echocardiogram An  exercise stress echocardiogram is a test to check how well your heart is working. This test uses sound waves (ultrasound) and a computer to make images of your heart before and after exercise. Ultrasound images that are taken before you exercise (resting echocardiogram) will show how much blood is getting to your heart muscle and how well yourheart muscle and heart valves are functioning. During the next part of this test, you will walk on a treadmill or ride a stationary bicycle to see how exercise affects your heart. While you exercise, the electrical activity of your heart will be monitored with anelectrocardiogram (ECG). Your blood pressure will also be monitored. You may have this test if you have: Chest pain or other symptoms of a heart problem. Recently had a heart attack or heart surgery. Heart valve problems. A condition that causes narrowing of the blood vessels that supply your heart (coronary artery disease). A high risk of heart disease and are starting a new exercise program. A high risk of heart disease and need to have major surgery. Heart arrhythmia (abnormal heart rhythm) problems. Heart failure problems. Tell a health care provider about: Any allergies you have. All medicines you are taking, including vitamins, herbs, eye drops, creams, and over-the-counter medicines. Any problems you or family members have had with anesthetic medicines. Any surgeries you have had. Any blood disorders you have. Any medical conditions you have. Whether you are pregnant or may be pregnant. What are the risks? Generally, this is a safe test. However, problems may occur, including: Chest pain. Dizziness or light-headedness. Shortness of breath. Increased or  irregular heartbeat (palpitations). Nausea or vomiting. Heart attack. This is very rare. What happens before the test? Medicines Ask your health care provider about changing or stopping your regular medicines. This is especially  important if you are taking diabetes medicines or blood thinners. If you use an inhaler, bring it with you to the test. General instructions Wear loose, comfortable clothing and walking shoes. Follow instructions from your health care provider about eating or drinking restrictions. You may be asked to avoid all forms of caffeine for 24 hours before your test, or as told by your health care provider. Do not use any products that contain nicotine or tobacco for 4 hours before the test, or as told by your health care provider. These products include cigarettes, chewing tobacco, and vaping devices, such as e-cigarettes. If you need help quitting, ask your health care provider. What happens during the test?  You will take off your clothes from the waist up and put on a hospital gown. Electrodes or electrocardiogram (ECG) patches may be placed on your chest. The electrodes or patches are then connected to a device that monitors your heart rate and rhythm. A blood pressure cuff will be placed on your arm. You will lie down on a table for an ultrasound exam before you exercise. A gel will be applied to your chest to help sound waves pass through your skin. A handheld device, called a transducer, will be pressed against your chest and moved over your heart. The transducer produces sound waves that travel to your heart and bounce back (or "echo" back) to the transducer. These sound waves will be captured in real-time and changed into images of your heart that can be viewed on a video monitor. The images will be recorded on a computer and reviewed by your health care provider. Once the ultrasound is complete, you will start exercising by walking on a treadmill or pedaling a stationary bicycle. Your blood pressure and heart rhythm will be monitored while you exercise. The exercise will gradually get harder or faster. You will exercise until: Your heart reaches a target level. You are too tired to  continue. You cannot continue because of chest pain, weakness, or dizziness. You will have another ultrasound exam immediately after you stop exercising. The procedure may vary among health care providers and hospitals. What can I expect after the test? After your test, it is common to have: Mild soreness. Mild fatigue. Your heart rate and blood pressure will be monitored until they return to yournormal levels. You should not have any new symptoms after this test. Follow these instructions at home: After your stress test, you should be able to return to your normal activities and diet. Take over-the-counter and prescription medicines only as told by your health care provider. Keep all follow-up visits. This is important. It is up to you to get the results of your test. Ask your health care provider, or the department that is doing the test, when your results will be ready. Contact a health care provider if you: Feel dizzy or light-headed. Have a fast or irregular heartbeat. Have nausea or vomiting. Have a headache. Feel short of breath. Get help right away if you: Develop pain or pressure: In your chest. In your jaw or neck. Between your shoulder blades. Radiating down your left arm. Faint. Have trouble breathing. These symptoms may represent a serious problem that is an emergency. Do not wait to see if the symptoms will go away. Get medical help right away.  Call your local emergency services (911 in the [place] Do not drive yourself to the hospital. Summary An exercise stress echocardiogram is a test that uses ultrasound to check how well your heart works before and after exercise. Before the test, follow instructions from your health care provider about stopping medicines and avoiding caffeine, nicotine and tobacco, and certain foods and drinks. During the test, your blood pressure and heart rhythm will be monitored while you exercise on a treadmill or stationary bicycle. This  information is not intended to replace advice given to you by your health care provider. Make sure you discuss any questions you have with your healthcare provider. Document Revised: 09/24/2019 Document Reviewed: 09/24/2019 Elsevier Patient Education  2022 [employer].

## 2020-12-24 NOTE — Progress Notes (Signed)
Cardiology Office Note:    Date:  12/24/2020   ID:  [name], DOB 07-18-1960, MRN [id number]  PCP:  [name], DO  Cardiologist:  Jenean Lindau, MD   Referring MD: [name]*    ASSESSMENT:    1. NSVT (nonsustained ventricular tachycardia)   2. Primary hypertension   3. Apical variant hypertrophic cardiomyopathy ([place])   4. Benign essential hypertension   5. Mixed hyperlipidemia    PLAN:    In order of problems listed above:  Primary prevention stressed with the patient.  Importance of compliance with diet medication stressed and vocalized understanding. Chest pain: Atypical in nature but is concerned and wants to be evaluated so we will do an exercise stress echo.\ Hypertrophic cardiomyopathy of the apical variant: Asymptomatic at this time.  No history of syncope.  He has been evaluated by our electrophysiology colleagues.  Medical management at this time. Essential hypertension: Blood pressure stable and diet was emphasized. Mixed dyslipidemia: Lipids were reviewed and diet emphasized. Patient will be seen in follow-up appointment in 6 months or earlier if the patient has any concerns    Medication Adjustments/Labs and Tests Ordered: Current medicines are reviewed at length with the patient today.  Concerns regarding medicines are outlined above.  Orders Placed This Encounter  Procedures   ECHOCARDIOGRAM STRESS TEST   Meds ordered this encounter  Medications   rosuvastatin (CRESTOR) 10 MG tablet    Sig: Take 1 tablet (10 mg total) by mouth daily.    Dispense:  90 tablet    Refill:  3     No chief complaint on file.    History of Present Illness:    Justin Frazier is a 60 y.o. male.  Patient has a past medical history of essential hypertension, dyslipidemia and apical variant of hypertrophic cardiomyopathy.  He denies any problems at this time.  He complains of stabbing-like pain in the chest at times not related to  exertion.  He wants to be evaluated for a stress test because of the symptoms and is concerned about it.  At the time of my evaluation, the patient is alert awake oriented and in no distress.  Past Medical History:  Diagnosis Date   Abnormal EKG 03/20/2017   Adhesive capsulitis of left shoulder 07/26/2019   Allergic rhinitis    Apical variant hypertrophic cardiomyopathy ([place]) 05/27/2019   Benign essential hypertension 05/22/2020   Carpal tunnel syndrome    Central serous retinopathy    CKD (chronic kidney disease), stage I    DOE (dyspnea on exertion) 03/30/2017   Dry eye syndrome 12/22/2020   Enlarged lymph node 01/16/2019   Glaucoma    HTN (hypertension) 03/20/2017   Hyperlipidemia 01/20/2020   Left groin pain 01/20/2020   Low back pain 05/22/2020   Lumbar strain    LVH (left ventricular hypertrophy) 03/20/2017   Mild intermittent asthma without complication 5/91/6384   Mild persistent asthma without complication 07/21/5991   Myopia 12/22/2020   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 Concern over right parotid mass. See HPI. Examination reveals a mobile 1 cm mass in the immediate right preauricular area.  No surrounding adenopathy.  Head and neck is otherwise negative for nodes. PLAN: We discussed most tumors of the parotid gland are benign.  Needle biopsy is helpful to determine the nature.   NSVT (nonsustained ventricular tachycardia)    Seen by  Dr. Mina Marble (EP) recommended beta blocker therapy (2014)   Onychomycosis 05/22/2020   Primary open angle glaucoma of both eyes 12/22/2020   Primary open-angle glaucoma, bilateral, severe stage 12/22/2020   Primary open-angle glaucoma, left eye, severe stage 12/22/2020   Proteinuria    Renal cyst, left 03/08/2018   Sciatica of left side    Thrombocytopenia ([place]) 02/01/2018   Tinea unguium 05/22/2020   Unintentional weight loss of more than 10 pounds 08/31/2018   Well adult exam  03/30/2017    Past Surgical History:  Procedure Laterality Date   COLONOSCOPY  06/11/2013    Current Medications: Current Meds  Medication Sig   albuterol (VENTOLIN HFA) 108 (90 Base) MCG/ACT inhaler INHALE 1 PUFF INTO THE LUNGS EVERY 6 HOURS AS NEEDED FOR WHEEZING OR SHORTNESS OF BREATH.   aspirin EC 81 MG tablet Take 81 mg by mouth daily.    Cholecalciferol 5000 units TABS Take 5,000 mg by mouth daily.   dorzolamide-timolol (COSOPT) 22.3-6.8 MG/ML ophthalmic solution Place 1 drop into both eyes 2 (two) times daily.   lisinopril (ZESTRIL) 10 MG tablet Take 1 tablet (10 mg total) by mouth daily.   metoprolol succinate (TOPROL-XL) 25 MG 24 hr tablet Take 25 mg by mouth daily.   montelukast (SINGULAIR) 10 MG tablet Take 1 tablet (10 mg total) by mouth daily.   RHOPRESSA 0.02 % SOLN Place 1 drop into both eyes daily.   traMADol (ULTRAM) 50 MG tablet Take 2 tabs every 6 hours as needed for moderate-severe pain. Not to exceed 4 tabs daily.   TRAVATAN Z 0.004 % SOLN ophthalmic solution Place 1 drop into both eyes at bedtime.   vitamin B-12 (CYANOCOBALAMIN) 1000 MCG tablet Take 1,000 mcg by mouth daily.   [DISCONTINUED] rosuvastatin (CRESTOR) 10 MG tablet TAKE 1 TABLET BY MOUTH EVERY DAY     Allergies:   Brinzolamide-brimonidine   Social History   Socioeconomic History   Marital status: Married    Spouse name: Not on file   Number of children: Not on file   Years of education: Not on file   Highest education level: Not on file  Occupational History   Not on file  Tobacco Use   Smoking status: Never   Smokeless tobacco: Never  Vaping Use   Vaping Use: Never used  Substance and Sexual Activity   Alcohol use: No   Drug use: Yes   Sexual activity: Not Currently  Other Topics Concern   Not on file  Social History Narrative   Not on file   Social Determinants of Health   Financial Resource Strain: Low Risk    Difficulty of Paying Living Expenses: Not hard at all  Food  Insecurity: No Food Insecurity   Worried About Charity fundraiser in the Last Year: Never true   Ran Out of Food in the Last Year: Never true  Transportation Needs: No Transportation Needs   Lack of Transportation (Medical): No   Lack of Transportation (Non-Medical): No  Physical Activity: Insufficiently Active   Days of Exercise per Week: 2 days   Minutes of Exercise per Session: 30 min  Stress: No Stress Concern Present   Feeling of Stress : Not at all  Social Connections: Moderately Integrated   Frequency of Communication with Friends and Family: More than three times a week   Frequency of Social Gatherings with Friends and Family: More than three times a week   Attends Religious Services: More than 4 times per year  Active Member of Clubs or Organizations: No   Attends Archivist Meetings: Never   Marital Status: Married     Family History: The patient's family history includes Alzheimer's disease in his mother; Hypertension in his father; Liver cancer in his father.  ROS:   Please see the history of present illness.    All other systems reviewed and are negative.  EKGs/Labs/Other Studies Reviewed:    The following studies were reviewed today: I discussed my findings with the patient at length   Recent Labs: 06/19/2020: BUN 17; Creatinine, Ser 1.18; Hemoglobin 14.5; Platelets 146.0; Potassium 3.8; Sodium 143; TSH 1.34 07/29/2020: ALT 12  Recent Lipid Panel    Component Value Date/Time   CHOL 125 07/29/2020 0737   CHOL 184 05/27/2019 0931   TRIG 56.0 07/29/2020 0737   HDL 49.40 07/29/2020 0737   HDL 47 05/27/2019 0931   CHOLHDL 3 07/29/2020 0737   VLDL 11.2 07/29/2020 0737   LDLCALC 64 07/29/2020 0737   LDLCALC 125 (H) 05/27/2019 0931    Physical Exam:    VS:  There were no vitals taken for this visit.    Wt Readings from Last 3 Encounters:  10/20/20 185 lb (83.9 kg)  10/09/20 178 lb (80.7 kg)  07/07/20 178 lb (80.7 kg)     GEN: Patient is in no  acute distress HEENT: Normal NECK: No JVD; No carotid bruits LYMPHATICS: No lymphadenopathy CARDIAC: Hear sounds regular, 2/6 systolic murmur at the apex. RESPIRATORY:  Clear to auscultation without rales, wheezing or rhonchi  ABDOMEN: Soft, non-tender, non-distended MUSCULOSKELETAL:  No edema; No deformity  SKIN: Warm and dry NEUROLOGIC:  Alert and oriented x 3 PSYCHIATRIC:  Normal affect   Signed, Jenean Lindau, MD  12/24/2020 1:45 PM    [facility]

## 2021-01-05 DIAGNOSIS — I4729 Other ventricular tachycardia: Secondary | ICD-10-CM | POA: Diagnosis not present

## 2021-01-05 DIAGNOSIS — R079 Chest pain, unspecified: Secondary | ICD-10-CM | POA: Diagnosis not present

## 2021-02-10 ENCOUNTER — Telehealth: Payer: Self-pay | Admitting: Family Medicine

## 2021-02-10 NOTE — Telephone Encounter (Signed)
Called and informed the patient of PCP response

## 2021-02-10 NOTE — Telephone Encounter (Signed)
Probably won't hurt, but not sure how much it will help either. Ty.

## 2021-02-10 NOTE — Telephone Encounter (Signed)
pt would like to know if he could take olive leafs for bp. please advise.

## 2021-05-14 ENCOUNTER — Other Ambulatory Visit: Payer: Self-pay | Admitting: Family Medicine

## 2021-05-14 DIAGNOSIS — I10 Essential (primary) hypertension: Secondary | ICD-10-CM

## 2021-05-20 ENCOUNTER — Other Ambulatory Visit: Payer: Self-pay | Admitting: Family Medicine

## 2021-05-20 DIAGNOSIS — J4530 Mild persistent asthma, uncomplicated: Secondary | ICD-10-CM

## 2021-06-02 IMAGING — MR MR CARDIA MORPHOLOGY W/O CM
45 of 48 series · 45 of 48 positions shown · IV contrast (gadavist)
Comparison: none

CLINICAL DATA: Clinical question of hypertrophic cardiomyopathy
59 year old African American Male

Study assume a HCT of 46
EXAM:
CARDIAC MRI
TECHNIQUE: The patient was scanned on a 1.5 Tesla GE magnet. A dedicated
cardiac coil was used. Functional imaging was done using Fiesta
sequences. [DATE], and 4 chamber views were done to assess for RWMA's.
Modified Luidson rule using a short axis stack was used to
calculate an ejection fraction on a dedicated work station using
Circle software. The patient received 9 cc of Gadavist. After 10
minutes inversion recovery sequences were used to assess for
infiltration and scar tissue.
CONTRAST:  9 cc  of Gadavist

[Series 4: [id number] · axial · 8.0mm · 1.41mm/px · 1 of 16 slices shown]
[im 1/16]
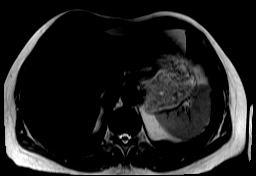

[Series 8: bSSFP · oblique · 8.0mm · 1.61mm/px · 1 of 25 slices shown (1 of 20)]
[im 1/25]
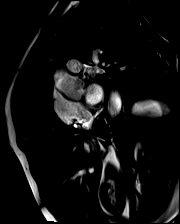

[Series 9: bSSFP · oblique · 8.0mm · 1.61mm/px · 1 of 25 slices shown (2 of 20)]
[im 1/25]
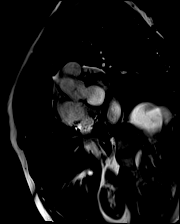

[Series 10: bSSFP · oblique · 8.0mm · 1.61mm/px · 1 of 25 slices shown (3 of 20)]
[im 1/25]
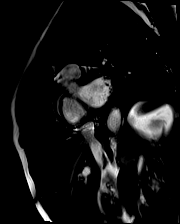

[Series 11: bSSFP · oblique · 8.0mm · 1.61mm/px · 1 of 25 slices shown (4 of 20)]
[im 1/25]
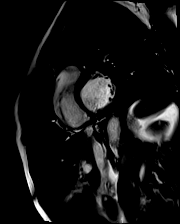

[Series 12: bSSFP · oblique · 8.0mm · 1.61mm/px · 1 of 25 slices shown (5 of 20)]
[im 1/25]
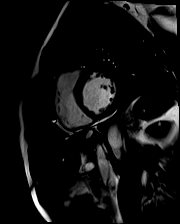

[Series 13: bSSFP · oblique · 8.0mm · 1.61mm/px · 1 of 25 slices shown (6 of 20)]
[im 1/25]
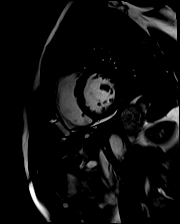

[Series 14: bSSFP · oblique · 8.0mm · 1.61mm/px · 1 of 25 slices shown (7 of 20)]
[im 1/25]
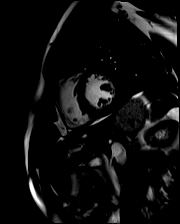

[Series 15: bSSFP · oblique · 8.0mm · 1.61mm/px · 1 of 25 slices shown (8 of 20)]
[im 1/25]
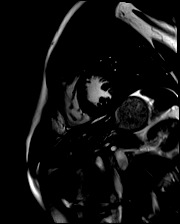

[Series 16: bSSFP · oblique · 8.0mm · 1.61mm/px · 1 of 25 slices shown (9 of 20)]
[im 1/25]
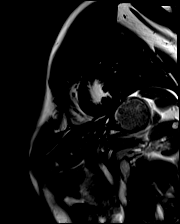

[Series 17: bSSFP · oblique · 8.0mm · 1.61mm/px · 1 of 25 slices shown (10 of 20)]
[im 1/25]
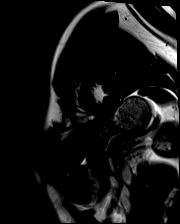

[Series 18: bSSFP · oblique · 8.0mm · 1.61mm/px · 1 of 25 slices shown (11 of 20)]
[im 1/25]
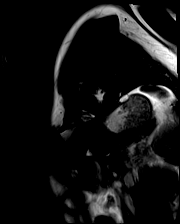

[Series 19: bSSFP · oblique · 8.0mm · 1.61mm/px · 1 of 25 slices shown (12 of 20)]
[im 1/25]
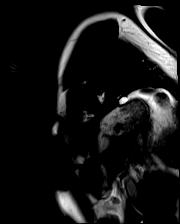

[Series 20: bSSFP · oblique · 8.0mm · 1.61mm/px · 1 of 25 slices shown (13 of 20)]
[im 1/25]
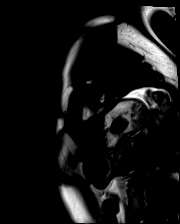

[Series 21: bSSFP · oblique · 8.0mm · 1.61mm/px · 1 of 25 slices shown (14 of 20)]
[im 1/25]
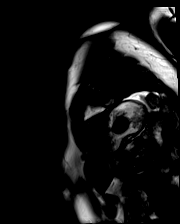

[Series 22: bSSFP · oblique · 8.0mm · 1.61mm/px · 1 of 25 slices shown (15 of 20)]
[im 1/25]
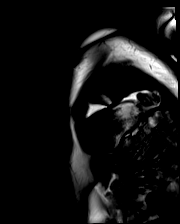

[Series 23: bSSFP · oblique · 8.0mm · 1.61mm/px · 1 of 25 slices shown (16 of 20)]
[im 1/25]
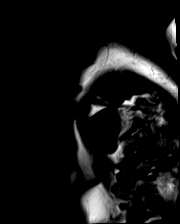

[Series 24: bSSFP · oblique · 6.0mm · 1.41mm/px · 1 of 25 slices shown (17 of 20)]
[im 1/25]
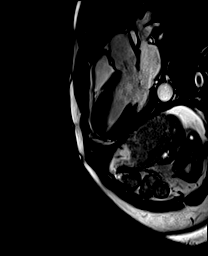

[Series 25: bSSFP · oblique · 6.0mm · 1.41mm/px · 1 of 25 slices shown (18 of 20)]
[im 1/25]
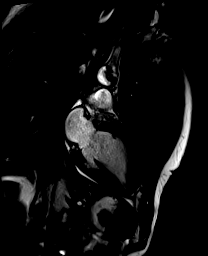

[Series 26: bSSFP · oblique · 6.0mm · 1.41mm/px · 1 of 25 slices shown (19 of 20)]
[im 1/25]
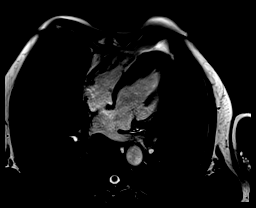

[Series 27: (id)_long_t1 · oblique · 8.0mm · 1.56mm/px · 1 of 24 slices shown]
[im 1/24]
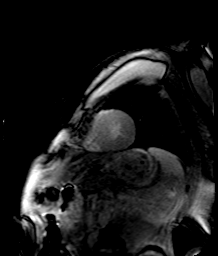

[Series 28: (id)_long_t1_moco · oblique · 8.0mm · 1.56mm/px · 1 of 24 slices shown]
[im 1/24]
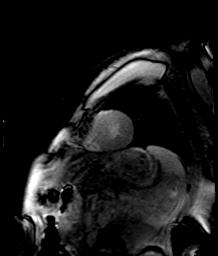

[Series 31: (id)_trufi · oblique · 8.0mm · 2.08mm/px · 1 of 9 slices shown]
[im 1/9]
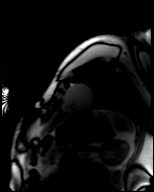

[Series 32: (id)_trufi_moco · oblique · 8.0mm · 2.08mm/px · 1 of 9 slices shown]
[im 1/9]
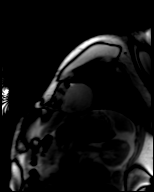

[Series 35: pre short axis · oblique · non-contrast · 8.0mm · 2.25mm/px · 1 of 10 slices shown (1 of 6)]
[im 1/10]
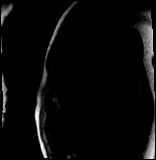

[Series 36: pre short axis · oblique · non-contrast · 8.0mm · 2.25mm/px · 1 of 10 slices shown (2 of 6)]
[im 1/10]
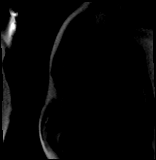

[Series 37: pre short axis · oblique · non-contrast · 8.0mm · 2.25mm/px · 1 of 10 slices shown (3 of 6)]
[im 1/10]
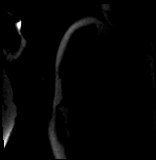

[Series 38: pre short axis · oblique · non-contrast · 8.0mm · 2.25mm/px · 1 of 10 slices shown (4 of 6)]
[im 1/10]
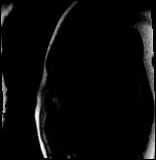

[Series 39: pre short axis · oblique · non-contrast · 8.0mm · 2.25mm/px · 1 of 10 slices shown (5 of 6)]
[im 1/10]
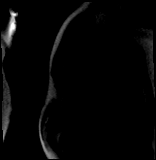

[Series 40: pre short axis · oblique · non-contrast · 8.0mm · 2.25mm/px · 1 of 10 slices shown (6 of 6)]
[im 1/10]
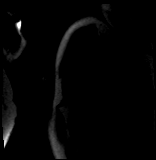

[Series 41: rest short axis · oblique · 8.0mm · 2.25mm/px · 1 of 60 slices shown (1 of 6)]
[im 1/60]
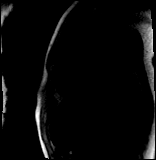

[Series 42: rest short axis · oblique · 8.0mm · 2.25mm/px · 1 of 60 slices shown (2 of 6)]
[im 1/60]
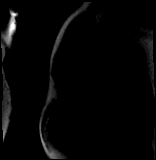

[Series 43: rest short axis · oblique · 8.0mm · 2.25mm/px · 1 of 60 slices shown (3 of 6)]
[im 1/60]
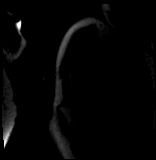

[Series 44: rest short axis · oblique · 8.0mm · 2.25mm/px · 1 of 60 slices shown (4 of 6)]
[im 1/60]
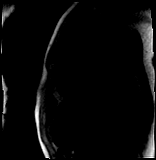

[Series 45: rest short axis · oblique · 8.0mm · 2.25mm/px · 1 of 60 slices shown (5 of 6)]
[im 1/60]
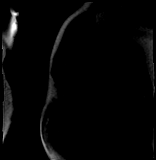

[Series 46: rest short axis · oblique · 8.0mm · 2.25mm/px · 1 of 60 slices shown (6 of 6)]
[im 1/60]
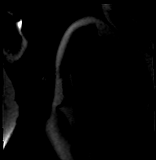

[Series 47: bSSFP · coronal · 6.0mm · 1.41mm/px · 1 of 25 slices shown (20 of 20)]
[im 1/25]
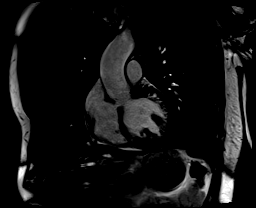

[Series 48: aortic valve cine · oblique · 6.0mm · 1.41mm/px · 1 of 25 slices shown (1 of 2)]
[im 1/25]
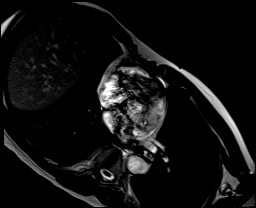

[Series 49: cine rvit · oblique · 6.0mm · 1.41mm/px · 1 of 25 slices shown]
[im 1/25]
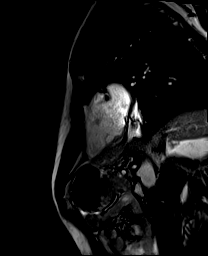

[Series 50: aortic valve cine · oblique · 6.0mm · 1.41mm/px · 1 of 25 slices shown (2 of 2)]
[im 1/25]
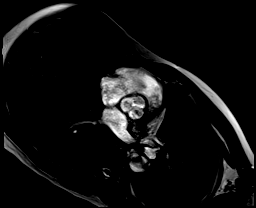

[Series 51: cine rvot · sagittal · 6.0mm · 1.41mm/px · 1 of 25 slices shown]
[im 1/25]
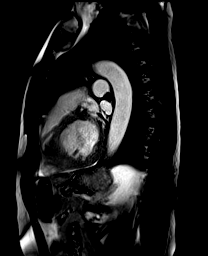

[Series 53: lge_single shot sa · oblique · 8.0mm · 2.08mm/px · 1 of 18 slices shown (1 of 2)]
[im 1/18]
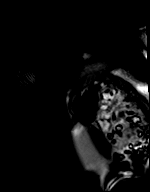

[Series 54: lge_single shot sa · oblique · 8.0mm · 2.08mm/px · 1 of 18 slices shown (2 of 2)]
[im 1/18]
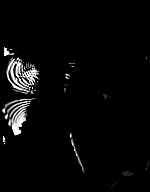

[Series 61: (id)_short_t1 · oblique · 8.0mm · 1.56mm/px · 1 of 27 slices shown]
[im 1/27]
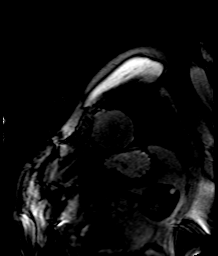

[Series 62: (id)_short_t1_moco · oblique · 8.0mm · 1.56mm/px · 1 of 27 slices shown]
[im 1/27]
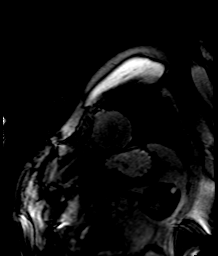

[45 of 48 positions shown; findings below may reference images not displayed]

FINDINGS: 1. Normal left ventricular size, with LVEDD 53 mm, and LVEDVi 76
mL/m2.

There is no basal hypertrophy with intraventricular septal thickness
of 5 mm, but there is mid intraventricular septum thickness of 16 mm
with apical hypertrophy of 19 mm, and an apex to base ratio >
(3.8).

Normal  myocardial mass index: 70 g/m2.

There is no apical aneurysm.

There is no velocity decoding suggestive of mid-cavitary gradient.

Normal left ventricular systolic function (LVEF =67%). There are no
regional wall motion abnormalities.

Left ventricular parametric mapping notable for elevated ECV signal
in the apex (apical lateral ECV signal highest at 39%) and elevated
T2 signal at the apex (true apex 56 ms).

There is late gadolinium enhancement in the left ventricular
myocardium: Patchy in the mid inferior and apex.

Quantitative LGE assessment using 6SD ISSE approach is less than 15%
(2.4 %).

2. Normal right ventricular size with RVEDVI 72 mL/m2.

Normal right ventricular thickness.

Normal right ventricular systolic function (RVEF =61 %). There are
no regional wall motion abnormalities or aneurysms.

3. Mild left atrial dilation and normal right atrial size, with
LAESV 39 mL/m2 and RAESV 22 mL/m2.

4. Normal size of the aortic root, ascending aorta and pulmonary
artery.

5.  No significant valvular abnormalities.

6.  Normal pericardium.  No pericardial effusion.

7. Grossly, no extracardiac findings. Recommended dedicated study if
concerned for non-cardiac pathology.
IMPRESSION: 1. Mid intraventricular septum thickness of 16 mm with apical
hypertrophy of 19 mm, and an apex to base ratio > 1.5 (3.8). There
is no apical aneurysm. There is no velocity decoding suggestive of
mid-cavitary gradient. This is suggestive of apical hypertrophic
cardiomyopathy.

2. There is late gadolinium enhancement in the left ventricular
myocardium: Patchy in the mid inferior and apex. Quantitative LGE
assessment using 6SD ISSE approach is less than 15% of total LV
myocardium (2.4 %).

3.  Normal left ventricular systolic function (LVEF =67%).

4. Mild left atrial dilation.

## 2021-06-18 ENCOUNTER — Ambulatory Visit: Payer: Medicare Other | Admitting: Cardiology

## 2021-06-21 ENCOUNTER — Ambulatory Visit (INDEPENDENT_AMBULATORY_CARE_PROVIDER_SITE_OTHER): Payer: Medicare Other | Admitting: Cardiology

## 2021-06-21 ENCOUNTER — Encounter: Payer: Self-pay | Admitting: Cardiology

## 2021-06-21 VITALS — BP 142/80 | HR 54 | Ht 75.0 in | Wt 196.8 lb

## 2021-06-21 DIAGNOSIS — I10 Essential (primary) hypertension: Secondary | ICD-10-CM | POA: Diagnosis not present

## 2021-06-21 DIAGNOSIS — E785 Hyperlipidemia, unspecified: Secondary | ICD-10-CM | POA: Diagnosis not present

## 2021-06-21 DIAGNOSIS — I422 Other hypertrophic cardiomyopathy: Secondary | ICD-10-CM | POA: Diagnosis not present

## 2021-06-21 DIAGNOSIS — I4729 Other ventricular tachycardia: Secondary | ICD-10-CM | POA: Diagnosis not present

## 2021-06-21 DIAGNOSIS — N181 Chronic kidney disease, stage 1: Secondary | ICD-10-CM | POA: Diagnosis not present

## 2021-06-21 DIAGNOSIS — E559 Vitamin D deficiency, unspecified: Secondary | ICD-10-CM

## 2021-06-21 DIAGNOSIS — E782 Mixed hyperlipidemia: Secondary | ICD-10-CM | POA: Diagnosis not present

## 2021-06-21 DIAGNOSIS — Z131 Encounter for screening for diabetes mellitus: Secondary | ICD-10-CM | POA: Diagnosis not present

## 2021-06-21 NOTE — Patient Instructions (Addendum)
Medication Instructions:  ?Your physician recommends that you continue on your current medications as directed. Please refer to the Current Medication list given to you today. ? ?*If you need a refill on your cardiac medications before your next appointment, please call your pharmacy* ? ? ?Lab Work: ?Your physician recommends that you have labs done in the office today. Your test included  basic metabolic panel, complete blood count, TSH, vitamin D, hgb A1C, liver function and lipids. ? ?If you have labs (blood work) drawn today and your tests are completely normal, you will receive your results only by: ?MyChart Message (if you have MyChart) OR ?A paper copy in the mail ?If you have any lab test that is abnormal or we need to change your treatment, we will call you to review the results. ? ? ?Testing/Procedures: ?None ordered ? ? ?Follow-Up: ?At [facility], you and your health needs are our priority.  As part of our continuing mission to provide you with exceptional heart care, we have created designated Provider Care Teams.  These Care Teams include your primary Cardiologist (physician) and Advanced Practice Providers (APPs -  Physician Assistants and Nurse Practitioners) who all work together to provide you with the care you need, when you need it. ? ?We recommend signing up for the patient portal called "MyChart".  Sign up information is provided on this After Visit Summary.  MyChart is used to connect with patients for Virtual Visits (Telemedicine).  Patients are able to view lab/test results, encounter notes, upcoming appointments, etc.  Non-urgent messages can be sent to your provider as well.   ?To learn more about what you can do with MyChart, go to [email or web address].   ? ?Your next appointment:   ?12 month(s) ? ?The format for your next appointment:   ?In Person ? ?Provider:   ?Jyl Heinz, MD ? ? ?Other Instructions ?NA   ?

## 2021-06-21 NOTE — Progress Notes (Signed)
?Cardiology Office Note:   ? ?Date:  06/21/2021  ? ?ID:  Justin Frazier, DOB 1960-03-15, MRN [id number] ? ?PCP:  [name], DO  ?Cardiologist:  Jenean Lindau, MD  ? ?Referring MD: [name]*  ? ? ?ASSESSMENT:   ? ?1. NSVT (nonsustained ventricular tachycardia) ([place])   ?2. Primary hypertension   ?3. Benign essential hypertension   ?4. Mixed hyperlipidemia   ?5. Diabetes mellitus screening   ?6. Vitamin D deficiency   ?7. Apical variant hypertrophic cardiomyopathy ([place])   ? ?PLAN:   ? ?In order of problems listed above: ? ?Primary prevention stressed with the patient.  Importance of compliance with diet medication stressed any vocalized understanding. ?Essential hypertension: Blood pressure stable and diet was emphasized.  Lifestyle modification measures.  He tells me that his blood pressure is fine at home and he was in a rush this morning. ?Apical variant hypertrophy: Stable at this time and asymptomatic no dizziness or syncopal spells.  He has been evaluated by electrophysiology colleagues. ?Mixed dyslipidemia: On statin therapy and lipids reviewed.  He will have a complete blood work done today as he is fasting.  He requests hemoglobin A1c and vitamin D and we will do this for him. ?Patient will be seen in follow-up appointment in 12 months or earlier if the patient has any concerns ? ? ? ?Medication Adjustments/Labs and Tests Ordered: ?Current medicines are reviewed at length with the patient today.  Concerns regarding medicines are outlined above.  ?Orders Placed This Encounter  ?Procedures  ? Basic metabolic panel  ? CBC with Differential/Platelet  ? Hepatic function panel  ? Lipid panel  ? TSH  ? VITAMIN D 25 Hydroxy (Vit-D Deficiency, Fractures)  ? Hemoglobin A1c  ? EKG 12-Lead  ? ?No orders of the defined types were placed in this encounter. ? ? ? ?No chief complaint on file. ?  ? ?History of Present Illness:   ? ?Justin Frazier is a 61 y.o. male.  Patient has past medical  history of essential hypertension, apical variant hypertrophic cardiomyopathy dyslipidemia.  He denies any problems at this time and takes care of activities of daily living.  No chest pain orthopnea or PND.  He is an active gentleman.  At the time of my evaluation, the patient is alert awake oriented and in no distress. ? ?Past Medical History:  ?Diagnosis Date  ? Abnormal EKG 03/20/2017  ? Adhesive capsulitis of left shoulder 07/26/2019  ? Allergic rhinitis   ? Apical variant hypertrophic cardiomyopathy ([place]) 05/27/2019  ? Benign essential hypertension 05/22/2020  ? Carpal tunnel syndrome   ? Central serous retinopathy   ? CKD (chronic kidney disease), stage I   ? DOE (dyspnea on exertion) 03/30/2017  ? Dry eye syndrome 12/22/2020  ? Enlarged lymph node 01/16/2019  ? Glaucoma   ? HTN (hypertension) 03/20/2017  ? Hyperlipidemia 01/20/2020  ? Left groin pain 01/20/2020  ? Low back pain 05/22/2020  ? Lumbar strain   ? LVH (left ventricular hypertrophy) 03/20/2017  ? Mild intermittent asthma without complication 0/04/7046  ? Mild persistent asthma without complication 09/22/9167  ? Myopia 12/22/2020  ? 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 Last Assessment & Plan:  Formatting of this note might be different from the original. Concern over right parotid mass. See HPI. Examination reveals a mobile 1 cm mass in the immediate right preauricular area.  No surrounding adenopathy.  Head and neck is otherwise negative for nodes. PLAN: We discussed most tumors  of the parotid gland are benign.  Needle biopsy is helpful to determine the nature.  ? NSVT (nonsustained ventricular tachycardia) ([place])   ? Seen by Dr. Mina Marble (EP) recommended beta blocker therapy (2014)  ? Onychomycosis 05/22/2020  ? Primary open angle glaucoma of both eyes 12/22/2020  ? Primary open-angle glaucoma, bilateral, severe stage 12/22/2020  ? Primary open-angle glaucoma, left eye, severe stage 12/22/2020  ? Proteinuria   ? Renal cyst, left 03/08/2018  ? Sciatica of left side   ?  Thrombocytopenia ([place]) 02/01/2018  ? Tinea unguium 05/22/2020  ? Unintentional weight loss of more than 10 pounds 08/31/2018  ? Well adult exam 03/30/2017  ? ? ?Past Surgical History:  ?Procedure Laterality Date  ? COLONOSCOPY  06/11/2013  ? ? ?Current Medications: ?Current Meds  ?Medication Sig  ? albuterol (VENTOLIN HFA) 108 (90 Base) MCG/ACT inhaler INHALE 1 PUFF INTO THE LUNGS EVERY 6 HOURS AS NEEDED FOR WHEEZING OR SHORTNESS OF BREATH.  ? aspirin EC 81 MG tablet Take 81 mg by mouth daily.   ? Cholecalciferol 5000 units TABS Take 5,000 mg by mouth daily.  ? dorzolamide-timolol (COSOPT) 22.3-6.8 MG/ML ophthalmic solution Place 1 drop into both eyes 2 (two) times daily.  ? lisinopril (ZESTRIL) 10 MG tablet TAKE 1 TABLET BY MOUTH EVERY DAY  ? metoprolol succinate (TOPROL-XL) 25 MG 24 hr tablet Take 25 mg by mouth daily.  ? montelukast (SINGULAIR) 10 MG tablet TAKE 1 TABLET BY MOUTH EVERY DAY  ? RHOPRESSA 0.02 % SOLN Place 1 drop into both eyes daily.  ? rosuvastatin (CRESTOR) 10 MG tablet Take 1 tablet (10 mg total) by mouth daily.  ? traMADol (ULTRAM) 50 MG tablet Take 2 tabs every 6 hours as needed for moderate-severe pain. Not to exceed 4 tabs daily.  ? TRAVATAN Z 0.004 % SOLN ophthalmic solution Place 1 drop into both eyes at bedtime.  ? vitamin B-12 (CYANOCOBALAMIN) 1000 MCG tablet Take 1,000 mcg by mouth daily.  ?  ? ?Allergies:   Brinzolamide-brimonidine  ? ?Social History  ? ?Socioeconomic History  ? Marital status: Married  ?  Spouse name: Not on file  ? Number of children: Not on file  ? Years of education: Not on file  ? Highest education level: Not on file  ?Occupational History  ? Not on file  ?Tobacco Use  ? Smoking status: Never  ? Smokeless tobacco: Never  ?Vaping Use  ? Vaping Use: Never used  ?Substance and Sexual Activity  ? Alcohol use: No  ? Drug use: Yes  ? Sexual activity: Not Currently  ?Other Topics Concern  ? Not on file  ?Social History Narrative  ? Not on file  ? ?Social Determinants of  Health  ? ?Financial Resource Strain: Low Risk   ? Difficulty of Paying Living Expenses: Not hard at all  ?Food Insecurity: No Food Insecurity  ? Worried About Charity fundraiser in the Last Year: Never true  ? Ran Out of Food in the Last Year: Never true  ?Transportation Needs: No Transportation Needs  ? Lack of Transportation (Medical): No  ? Lack of Transportation (Non-Medical): No  ?Physical Activity: Insufficiently Active  ? Days of Exercise per Week: 2 days  ? Minutes of Exercise per Session: 30 min  ?Stress: No Stress Concern Present  ? Feeling of Stress : Not at all  ?Social Connections: Moderately Integrated  ? Frequency of Communication with Friends and Family: More than three times a week  ? Frequency of Social  Gatherings with Friends and Family: More than three times a week  ? Attends Religious Services: More than 4 times per year  ? Active Member of Clubs or Organizations: No  ? Attends Archivist Meetings: Never  ? Marital Status: Married  ?  ? ?Family History: ?The patient's family history includes Alzheimer's disease in his mother; Hypertension in his father; Liver cancer in his father. ? ?ROS:   ?Please see the history of present illness.    ?All other systems reviewed and are negative. ? ?EKGs/Labs/Other Studies Reviewed:   ? ?The following studies were reviewed today: ?I discussed my findings with the patient at length.  EKG reveals sinus rhythm and nonspecific ST-T changes ? ? ?Recent Labs: ?07/29/2020: ALT 12  ?Recent Lipid Panel ?   ?Component Value Date/Time  ? CHOL 125 07/29/2020 0737  ? CHOL 184 05/27/2019 0931  ? TRIG 56.0 07/29/2020 0737  ? HDL 49.40 07/29/2020 0737  ? HDL 47 05/27/2019 0931  ? CHOLHDL 3 07/29/2020 0737  ? VLDL 11.2 07/29/2020 0737  ? [place] 64 07/29/2020 0737  ? LDLCALC 125 (H) 05/27/2019 0931  ? ? ?Physical Exam:   ? ?VS:  BP (!) 142/80   Pulse (!) 54   Ht '6\' 3"'$  (1.905 m)   Wt 196 lb 12.8 oz (89.3 kg)   SpO2 99%   BMI 24.60 kg/m?    ? ?Wt Readings from  Last 3 Encounters:  ?06/21/21 196 lb 12.8 oz (89.3 kg)  ?12/24/20 190 lb 12.8 oz (86.5 kg)  ?10/20/20 185 lb (83.9 kg)  ?  ? ?GEN: Patient is in no acute distress ?HEENT: Normal ?NECK: No JVD; No carotid bruits ?LYMPHA

## 2021-06-22 LAB — BASIC METABOLIC PANEL
BUN/Creatinine Ratio: 10 (ref 10–24)
BUN: 13 mg/dL (ref 8–27)
CO2: 24 mmol/L (ref 20–29)
Calcium: 9.2 mg/dL (ref 8.6–10.2)
Chloride: 107 mmol/L — ABNORMAL HIGH (ref 96–106)
Creatinine, Ser: 1.25 mg/dL (ref 0.76–1.27)
Glucose: 94 mg/dL (ref 70–99)
Potassium: 3.9 mmol/L (ref 3.5–5.2)
Sodium: 142 mmol/L (ref 134–144)
eGFR: 66 mL/min/{1.73_m2} (ref >59)

## 2021-06-22 LAB — LIPID PANEL
Chol/HDL Ratio: 2.3 ratio (ref 0.0–5.0)
Cholesterol, Total: 124 mg/dL (ref 100–199)
HDL: 55 mg/dL (ref >39)
LDL Chol Calc (NIH): 55 mg/dL (ref 0–99)
Triglycerides: 67 mg/dL (ref 0–149)
VLDL Cholesterol Cal: 14 mg/dL (ref 5–40)

## 2021-06-22 LAB — CBC WITH DIFFERENTIAL/PLATELET
Basophils Absolute: 0 10*3/uL (ref 0.0–0.2)
Basos: 1 %
EOS (ABSOLUTE): 0.1 10*3/uL (ref 0.0–0.4)
Eos: 2 %
Hematocrit: 43.1 % (ref 37.5–51.0)
Hemoglobin: 14.5 g/dL (ref 13.0–17.7)
Immature Grans (Abs): 0 10*3/uL (ref 0.0–0.1)
Immature Granulocytes: 0 %
Lymphocytes Absolute: 1.7 10*3/uL (ref 0.7–3.1)
Lymphs: 34 %
MCH: 31.1 pg (ref 26.6–33.0)
MCHC: 33.6 g/dL (ref 31.5–35.7)
MCV: 93 fL (ref 79–97)
Monocytes Absolute: 0.5 10*3/uL (ref 0.1–0.9)
Monocytes: 10 %
Neutrophils Absolute: 2.6 10*3/uL (ref 1.4–7.0)
Neutrophils: 53 %
Platelets: 137 10*3/uL — ABNORMAL LOW (ref 150–450)
RBC: 4.66 x10E6/uL (ref 4.14–5.80)
RDW: 12.2 % (ref 11.6–15.4)
WBC: 4.9 10*3/uL (ref 3.4–10.8)

## 2021-06-22 LAB — HEPATIC FUNCTION PANEL
ALT: 15 IU/L (ref 0–44)
AST: 22 IU/L (ref 0–40)
Albumin: 4.2 g/dL (ref 3.8–4.8)
Alkaline Phosphatase: 64 IU/L (ref 44–121)
Bilirubin Total: 0.6 mg/dL (ref 0.0–1.2)
Bilirubin, Direct: 0.17 mg/dL (ref 0.00–0.40)
Total Protein: 6.6 g/dL (ref 6.0–8.5)

## 2021-06-22 LAB — TSH: TSH: 1.18 u[IU]/mL (ref 0.450–4.500)

## 2021-06-22 LAB — VITAMIN D 25 HYDROXY (VIT D DEFICIENCY, FRACTURES): Vit D, 25-Hydroxy: 58.3 ng/mL (ref 30.0–100.0)

## 2021-06-22 LAB — HEMOGLOBIN A1C
Est. average glucose Bld gHb Est-mCnc: 114 mg/dL
Hgb A1c MFr Bld: 5.6 % (ref 4.8–5.6)

## 2021-09-02 ENCOUNTER — Other Ambulatory Visit: Payer: Self-pay

## 2021-09-02 ENCOUNTER — Telehealth: Payer: Self-pay | Admitting: Family Medicine

## 2021-09-02 NOTE — Telephone Encounter (Signed)
Medication:Tramadol   Has the patient contacted their pharmacy? No.   Preferred Pharmacy (with phone number or street name):   [id number] [place] [id number] Phone:  [phone] Fax:  [phone]  Agent: Please be advised that RX refills may take up to 3 business days. We ask that you follow-up with your pharmacy.

## 2021-09-02 NOTE — Telephone Encounter (Signed)
Appt made

## 2021-09-02 NOTE — Telephone Encounter (Signed)
Due for med ck or CPE, whichever he prefers. Ty.

## 2021-09-03 ENCOUNTER — Encounter: Payer: Self-pay | Admitting: Family Medicine

## 2021-09-03 ENCOUNTER — Ambulatory Visit (INDEPENDENT_AMBULATORY_CARE_PROVIDER_SITE_OTHER): Payer: Medicare Other | Admitting: Family Medicine

## 2021-09-03 VITALS — BP 122/82 | HR 61 | Temp 98.2°F | Ht 75.0 in | Wt 192.2 lb

## 2021-09-03 DIAGNOSIS — M549 Dorsalgia, unspecified: Secondary | ICD-10-CM | POA: Diagnosis not present

## 2021-09-03 DIAGNOSIS — N401 Enlarged prostate with lower urinary tract symptoms: Secondary | ICD-10-CM

## 2021-09-03 DIAGNOSIS — R972 Elevated prostate specific antigen [PSA]: Secondary | ICD-10-CM

## 2021-09-03 DIAGNOSIS — R3912 Poor urinary stream: Secondary | ICD-10-CM | POA: Diagnosis not present

## 2021-09-03 HISTORY — DX: Benign prostatic hyperplasia with lower urinary tract symptoms: N40.1

## 2021-09-03 MED ORDER — TIZANIDINE HCL 4 MG PO TABS: 4.0000 mg | ORAL_TABLET | Freq: Four times a day (QID) | ORAL | 0 refills | Status: DC | PRN

## 2021-09-03 MED ORDER — KETOROLAC TROMETHAMINE 60 MG/2ML IM SOLN
60.0000 mg | Freq: Once | INTRAMUSCULAR | Status: AC
  Administered 2021-09-03: 60 mg via INTRAMUSCULAR

## 2021-09-03 MED ORDER — TAMSULOSIN HCL 0.4 MG PO CAPS: 0.4000 mg | ORAL_CAPSULE | Freq: Every day | ORAL | 3 refills | Status: DC

## 2021-09-03 MED ORDER — TRAMADOL HCL 50 MG PO TABS: ORAL_TABLET | ORAL | 0 refills | Status: DC

## 2021-09-03 NOTE — Progress Notes (Signed)
Musculoskeletal Exam  Patient: Justin Frazier DOB: 1960/04/11  DOS: 09/03/2021  SUBJECTIVE:  Chief Complaint:   Chief Complaint  Patient presents with   Follow-up    Back pain     Justin Frazier is a 61 y.o.  male for evaluation and treatment of back pain.   Onset:  1 week ago. Was pushing a chair out of his way.  Location: lower but worse on R Character:  sharp  Progression of issue:  has slightly improved Associated symptoms: No bruising, redness, swelling. Denies bowel/bladder incontinence or weakness Treatment: to date has been acetaminophen.   Neurovascular symptoms: no  For many years, the patient has had a weak stream.  He is urinating around 3 times per night.  His cousin recently told him he started Flomax for prostate issues and it significantly helped.  The patient is very interested in trying this.  Of note, he does drink water in the middle the night.  Past Medical History:  Diagnosis Date   Abnormal EKG 03/20/2017   Adhesive capsulitis of left shoulder 07/26/2019   Allergic rhinitis    Apical variant hypertrophic cardiomyopathy ([place]) 05/27/2019   Benign essential hypertension 05/22/2020   Carpal tunnel syndrome    Central serous retinopathy    CKD (chronic kidney disease), stage I    DOE (dyspnea on exertion) 03/30/2017   Dry eye syndrome 12/22/2020   Enlarged lymph node 01/16/2019   Glaucoma    HTN (hypertension) 03/20/2017   Hyperlipidemia 01/20/2020   Left groin pain 01/20/2020   Low back pain 05/22/2020   Lumbar strain    LVH (left ventricular hypertrophy) 03/20/2017   Mild intermittent asthma without complication 3/76/2831   Mild persistent asthma without complication 06/15/7614   Myopia 12/22/2020   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 Concern over right parotid mass. See HPI. Examination reveals a mobile 1 cm mass in the immediate right preauricular area.  No surrounding  adenopathy.  Head and neck is otherwise negative for nodes. PLAN: We discussed most tumors of the parotid gland are benign.  Needle biopsy is helpful to determine the nature.   NSVT (nonsustained ventricular tachycardia) ([facility])    Seen by Dr. Mina Marble (EP) recommended beta blocker therapy (2014)   Onychomycosis 05/22/2020   Primary open angle glaucoma of both eyes 12/22/2020   Primary open-angle glaucoma, bilateral, severe stage 12/22/2020   Primary open-angle glaucoma, left eye, severe stage 12/22/2020   Proteinuria    Renal cyst, left 03/08/2018   Sciatica of left side    Thrombocytopenia ([place]) 02/01/2018   Tinea unguium 05/22/2020   Unintentional weight loss of more than 10 pounds 08/31/2018   Well adult exam 03/30/2017    Objective:  VITAL SIGNS: BP 122/82   Pulse 61   Temp 98.2 F (36.8 C) (Oral)   Ht '6\' 3"'$  (1.905 m)   Wt 192 lb 4 oz (87.2 kg)   SpO2 98%   BMI 24.03 kg/m  Constitutional: Well formed, well developed. No acute distress. HENT: Normocephalic, atraumatic.  Thorax & Lungs:  No accessory muscle use Musculoskeletal: low back.   Tenderness to palpation: Yes over the lumbar paraspinal musculature, worse in the right Deformity: no Ecchymosis: no Straight leg test: Pain with slight hip flexion bilaterally, unable to obtain straight leg test Neurologic: Normal sensory function. No focal deficits noted. DTR's equal and symmetric in LE's. No clonus.  Gait is antalgic.  Psychiatric: Normal mood. Age appropriate judgment and insight. Alert & oriented x 3.    Assessment:  Back pain, unspecified back location, unspecified back pain laterality, unspecified chronicity - Plan: traMADol (ULTRAM) 50 MG tablet, tiZANidine (ZANAFLEX) 4 MG tablet, ketorolac (TORADOL) injection 60 mg  Benign prostatic hyperplasia with weak urinary stream - Plan: tamsulosin (FLOMAX) 0.4 MG CAPS capsule  Increased prostate specific antigen (PSA) velocity - Plan: Ambulatory referral to Urology  Plan: 1.   Continue stretches/exercises, heat, ice, Tylenol, Toradol injection today, tizanidine as needed, Ultram as needed.  PT if no better. 2.  Chronic, unstable.  Start Flomax 0.4 mg daily.  Warned about orthostatic hypotension.  He will get up slowly and stay hydrated but stop drinking water in the middle the night. 3.  He has a history of increased PSA velocity and was seen by [facility].  He was not particularly fond of his physicians so is requesting a different group.  We will refer to [facility]. Follow-up in 1 month to recheck #2 and do a physical. The patient voiced understanding and agreement to the plan.   [place], DO 09/03/21  12:06 PM

## 2021-09-03 NOTE — Patient Instructions (Addendum)
Stay hydrated but stop drinking at night.  Get up cautiously.   Do the stretches/exercises for your back.  OK to take Tylenol 1000 mg (2 extra strength tabs) or 975 mg (3 regular strength tabs) every 6 hours as needed.  Heat (pad or rice pillow in microwave) over affected area, 10-15 minutes twice daily.   Ice/cold pack over area for 10-15 min twice daily.  Let us know if you need anything.

## 2021-10-19 ENCOUNTER — Telehealth: Payer: Self-pay | Admitting: Family Medicine

## 2021-10-19 DIAGNOSIS — I10 Essential (primary) hypertension: Secondary | ICD-10-CM

## 2021-10-19 DIAGNOSIS — E785 Hyperlipidemia, unspecified: Secondary | ICD-10-CM

## 2021-10-19 DIAGNOSIS — Z0000 Encounter for general adult medical examination without abnormal findings: Secondary | ICD-10-CM

## 2021-10-19 NOTE — Telephone Encounter (Signed)
Called and scheduled appt/ put in the order.

## 2021-10-19 NOTE — Telephone Encounter (Signed)
OK. CBC, lipid panel, CMP, PSA I would hold off on as he will be seeing urology.

## 2021-10-19 NOTE — Telephone Encounter (Signed)
Patient would like to come in today or tomorrow morning to get labs done before his physical tomorrow afternoon. Please advise.

## 2021-10-20 ENCOUNTER — Other Ambulatory Visit (INDEPENDENT_AMBULATORY_CARE_PROVIDER_SITE_OTHER): Payer: Medicare Other

## 2021-10-20 ENCOUNTER — Ambulatory Visit (INDEPENDENT_AMBULATORY_CARE_PROVIDER_SITE_OTHER): Payer: Medicare Other | Admitting: Family Medicine

## 2021-10-20 ENCOUNTER — Other Ambulatory Visit: Payer: Medicare Other

## 2021-10-20 ENCOUNTER — Encounter: Payer: Self-pay | Admitting: Family Medicine

## 2021-10-20 VITALS — BP 138/82 | HR 86 | Temp 98.1°F | Resp 17 | Ht 75.0 in | Wt 191.8 lb

## 2021-10-20 DIAGNOSIS — E785 Hyperlipidemia, unspecified: Secondary | ICD-10-CM | POA: Diagnosis not present

## 2021-10-20 DIAGNOSIS — R972 Elevated prostate specific antigen [PSA]: Secondary | ICD-10-CM | POA: Diagnosis not present

## 2021-10-20 DIAGNOSIS — R3912 Poor urinary stream: Secondary | ICD-10-CM

## 2021-10-20 DIAGNOSIS — N401 Enlarged prostate with lower urinary tract symptoms: Secondary | ICD-10-CM

## 2021-10-20 DIAGNOSIS — I10 Essential (primary) hypertension: Secondary | ICD-10-CM

## 2021-10-20 DIAGNOSIS — Z0000 Encounter for general adult medical examination without abnormal findings: Secondary | ICD-10-CM

## 2021-10-20 LAB — LIPID PANEL
Cholesterol: 133 mg/dL (ref 0–200)
HDL: 52.4 mg/dL (ref >39.00)
LDL Cholesterol: 67 mg/dL (ref 0–99)
NonHDL: 80.69
Total CHOL/HDL Ratio: 3
Triglycerides: 68 mg/dL (ref 0.0–149.0)
VLDL: 13.6 mg/dL (ref 0.0–40.0)

## 2021-10-20 LAB — CBC
HCT: 42.3 % (ref 39.0–52.0)
Hemoglobin: 14 g/dL (ref 13.0–17.0)
MCHC: 33 g/dL (ref 30.0–36.0)
MCV: 95.5 fl (ref 78.0–100.0)
Platelets: 127 10*3/uL — ABNORMAL LOW (ref 150.0–400.0)
RBC: 4.42 Mil/uL (ref 4.22–5.81)
RDW: 13.7 % (ref 11.5–15.5)
WBC: 4.7 10*3/uL (ref 4.0–10.5)

## 2021-10-20 LAB — COMPREHENSIVE METABOLIC PANEL
ALT: 13 U/L (ref 0–53)
AST: 18 U/L (ref 0–37)
Albumin: 3.9 g/dL (ref 3.5–5.2)
Alkaline Phosphatase: 64 U/L (ref 39–117)
BUN: 17 mg/dL (ref 6–23)
CO2: 26 mEq/L (ref 19–32)
Calcium: 9.2 mg/dL (ref 8.4–10.5)
Chloride: 105 mEq/L (ref 96–112)
Creatinine, Ser: 1.14 mg/dL (ref 0.40–1.50)
GFR: 69.48 mL/min (ref >60.00)
Glucose, Bld: 78 mg/dL (ref 70–99)
Potassium: 3.9 mEq/L (ref 3.5–5.1)
Sodium: 139 mEq/L (ref 135–145)
Total Bilirubin: 1 mg/dL (ref 0.2–1.2)
Total Protein: 6.7 g/dL (ref 6.0–8.3)

## 2021-10-20 LAB — PSA: PSA: 3.07 ng/mL (ref 0.10–4.00)

## 2021-10-20 MED ORDER — TAMSULOSIN HCL 0.4 MG PO CAPS: 0.4000 mg | ORAL_CAPSULE | Freq: Every day | ORAL | 3 refills | Status: DC

## 2021-10-20 NOTE — Progress Notes (Signed)
Chief Complaint  Patient presents with   Annual Exam    CPE- fasting and had labs this morning. Wants PSA added to Labs     Well Male Justin Frazier is here for a complete physical.   His last physical was >1 year ago.  Current diet: in general, a "healthy" diet.  Current exercise: walking Weight trend: stable Fatigue out of ordinary? No. Seat belt? Yes.   Advanced directive? Yes  Health maintenance Shingrix- No Colonoscopy- Yes Tetanus- Yes HIV- Yes Hep C- Yes   Past Medical History:  Diagnosis Date   Abnormal EKG 03/20/2017   Adhesive capsulitis of left shoulder 07/26/2019   Allergic rhinitis    Apical variant hypertrophic cardiomyopathy ([place]) 05/27/2019   Benign essential hypertension 05/22/2020   Carpal tunnel syndrome    Central serous retinopathy    CKD (chronic kidney disease), stage I    DOE (dyspnea on exertion) 03/30/2017   Dry eye syndrome 12/22/2020   Enlarged lymph node 01/16/2019   Glaucoma    HTN (hypertension) 03/20/2017   Hyperlipidemia 01/20/2020   Left groin pain 01/20/2020   Low back pain 05/22/2020   Lumbar strain    LVH (left ventricular hypertrophy) 03/20/2017   Mild intermittent asthma without complication 9/93/7169   Mild persistent asthma without complication 07/21/8936   Myopia 12/22/2020   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 Concern over right parotid mass. See HPI. Examination reveals a mobile 1 cm mass in the immediate right preauricular area.  No surrounding adenopathy.  Head and neck is otherwise negative for nodes. PLAN: We discussed most tumors of the parotid gland are benign.  Needle biopsy is helpful to determine the nature.   NSVT (nonsustained ventricular tachycardia) ([facility])    Seen by Dr. Mina Marble (EP) recommended beta blocker therapy (2014)   Onychomycosis 05/22/2020   Primary open angle glaucoma of both eyes 12/22/2020   Primary open-angle glaucoma, bilateral,  severe stage 12/22/2020   Primary open-angle glaucoma, left eye, severe stage 12/22/2020   Proteinuria    Renal cyst, left 03/08/2018   Sciatica of left side    Thrombocytopenia ([place]) 02/01/2018   Tinea unguium 05/22/2020   Unintentional weight loss of more than 10 pounds 08/31/2018   Well adult exam 03/30/2017      Past Surgical History:  Procedure Laterality Date   COLONOSCOPY  06/11/2013    Medications  Current Outpatient Medications on File Prior to Visit  Medication Sig Dispense Refill   albuterol (VENTOLIN HFA) 108 (90 Base) MCG/ACT inhaler INHALE 1 PUFF INTO THE LUNGS EVERY 6 HOURS AS NEEDED FOR WHEEZING OR SHORTNESS OF BREATH. 8.5 each 2   aspirin EC 81 MG tablet Take 81 mg by mouth daily.      Cholecalciferol 5000 units TABS Take 5,000 mg by mouth daily.     dorzolamide-timolol (COSOPT) 22.3-6.8 MG/ML ophthalmic solution Place 1 drop into both eyes 2 (two) times daily.     lisinopril (ZESTRIL) 10 MG tablet TAKE 1 TABLET BY MOUTH EVERY DAY 90 tablet 2   metoprolol succinate (TOPROL-XL) 25 MG 24 hr tablet Take 25 mg by mouth daily.     montelukast (SINGULAIR) 10 MG tablet TAKE 1 TABLET BY MOUTH EVERY DAY 90 tablet 2   RHOPRESSA 0.02 % SOLN Place 1 drop into both eyes daily.     rosuvastatin (CRESTOR) 10 MG tablet Take 1 tablet (10 mg total) by mouth  daily. 90 tablet 3   tiZANidine (ZANAFLEX) 4 MG tablet Take 1 tablet (4 mg total) by mouth every 6 (six) hours as needed for muscle spasms. 30 tablet 0   traMADol (ULTRAM) 50 MG tablet Take 2 tabs every 6 hours as needed for moderate-severe pain. Not to exceed 4 tabs daily. 90 tablet 0   TRAVATAN Z 0.004 % SOLN ophthalmic solution Place 1 drop into both eyes at bedtime.     vitamin B-12 (CYANOCOBALAMIN) 1000 MCG tablet Take 1,000 mcg by mouth daily.     Allergies Allergies  Allergen Reactions   Brinzolamide-Brimonidine     Other reaction(s): Itching of eye, Red eye, Itching of eye, Red eye    Family History Family History   Problem Relation Age of Onset   Alzheimer's disease Mother    Hypertension Father    Liver cancer Father     Review of Systems: Constitutional:  no fevers Eye:  no recent significant change in vision Ear/Nose/Mouth/Throat:  Ears:  no hearing loss Nose/Mouth/Throat:  no complaints of nasal congestion, no sore throat Cardiovascular:  no chest pain Respiratory:  no shortness of breath Gastrointestinal:  no change in bowel habits GU:  Male: negative for dysuria, frequency Musculoskeletal/Extremities:  no joint pain Integumentary (Skin/Breast):  no abnormal skin lesions reported Neurologic:  no headaches Endocrine: No unexpected weight changes Hematologic/Lymphatic:  no abnormal bleeding  Exam BP 138/82   Pulse 86   Temp 98.1 F (36.7 C) (Oral)   Resp 17   Ht '6\' 3"'$  (1.905 m)   Wt 191 lb 12.8 oz (87 kg)   SpO2 98%   BMI 23.97 kg/m  General:  well developed, well nourished, in no apparent distress Skin:  no significant moles, warts, or growths Head:  no masses, lesions, or tenderness Eyes:  pupils equal and round, sclera anicteric without injection Ears:  canals without lesions, TMs shiny without retraction, no obvious effusion, no erythema Nose:  nares patent, septum midline, mucosa normal Throat/Pharynx:  lips and gingiva without lesion; tongue and uvula midline; non-inflamed pharynx; no exudates or postnasal drainage Neck: neck supple without adenopathy, thyromegaly, or masses Cardiac: RRR, no bruits, no LE edema Lungs:  clear to auscultation, breath sounds equal bilaterally, no respiratory distress Abdomen: BS+, soft, non-tender, non-distended, no masses or organomegaly noted Rectal: Deferred Musculoskeletal:  symmetrical muscle groups noted without atrophy or deformity Neuro:  gait normal; deep tendon reflexes normal and symmetric Psych: well oriented with normal range of affect and appropriate judgment/insight  Assessment and Plan  Increased prostate specific  antigen (PSA) velocity - Plan: PSA  Well adult exam - Plan: Comprehensive metabolic panel, Lipid panel, CBC, PSA  Hyperlipidemia, unspecified hyperlipidemia type - Plan: Comprehensive metabolic panel, Lipid panel, CBC  Benign essential hypertension - Plan: Comprehensive metabolic panel, Lipid panel, CBC  Benign prostatic hyperplasia with weak urinary stream - Plan: tamsulosin (FLOMAX) 0.4 MG CAPS capsule   Well 61 y.o. male. Counseled on diet and exercise. Counseled on risks and benefits of prostate cancer screening with PSA. The patient agrees to undergo testing. Will add to sample from this AM.  Shingrix rec'd.  Advanced directive form requested today.  Follow up in 6 mo or prn. The patient voiced understanding and agreement to the plan.  [place], DO 10/20/21 1:46 PM

## 2021-10-20 NOTE — Patient Instructions (Signed)
Keep the diet clean and stay active.  I recommend getting the flu shot in mid October. This suggestion would change if the CDC comes out with a different recommendation.   The Shingrix vaccine (for shingles) is a 2 shot series spaced 2-6 months apart. It can make people feel low energy, achy and almost like they have the flu for 48 hours after injection. 1/5 people can have nausea and/or vomiting. Please plan accordingly when deciding on when to get this shot. Call our office for a nurse visit appointment to get this. The second shot of the series is less severe regarding the side effects, but it still lasts 48 hours.   Please get me a copy of your advanced directive form at your convenience.   Let us know if you need anything.

## 2022-02-13 ENCOUNTER — Other Ambulatory Visit: Payer: Self-pay | Admitting: Family Medicine

## 2022-02-13 DIAGNOSIS — I10 Essential (primary) hypertension: Secondary | ICD-10-CM

## 2022-02-20 ENCOUNTER — Other Ambulatory Visit: Payer: Self-pay | Admitting: Family Medicine

## 2022-02-20 DIAGNOSIS — J4530 Mild persistent asthma, uncomplicated: Secondary | ICD-10-CM

## 2022-03-09 ENCOUNTER — Other Ambulatory Visit: Payer: Self-pay | Admitting: Cardiology

## 2022-04-25 ENCOUNTER — Encounter: Payer: Self-pay | Admitting: Family Medicine

## 2022-05-30 ENCOUNTER — Encounter: Payer: Self-pay | Admitting: *Deleted

## 2022-06-02 ENCOUNTER — Telehealth: Payer: Self-pay | Admitting: Family Medicine

## 2022-06-02 DIAGNOSIS — J4530 Mild persistent asthma, uncomplicated: Secondary | ICD-10-CM

## 2022-06-02 MED ORDER — ALBUTEROL SULFATE HFA 108 (90 BASE) MCG/ACT IN AERS: INHALATION_SPRAY | RESPIRATORY_TRACT | 2 refills | Status: DC

## 2022-06-02 NOTE — Telephone Encounter (Signed)
Rx sent 

## 2022-06-02 NOTE — Telephone Encounter (Signed)
Prescription Request  06/02/2022  Is this a "Controlled Substance" medicine? Yes  LOV: Visit date not found  What is the name of the medication or equipment? albuterol (VENTOLIN HFA) 108 (90 Base) MCG/ACT inhaler   Refills for EpiPen  Have you contacted your pharmacy to request a refill? No   Which pharmacy would you like this sent to?  [id number] [place], [place] - [address] [place] [address] Phone: [phone] Fax: [phone]    Patient notified that their request is being sent to the clinical staff for review and that they should receive a response within 2 business days.   Please advise at Mobile [phone] (mobile)

## 2022-06-04 ENCOUNTER — Other Ambulatory Visit: Payer: Self-pay | Admitting: Cardiology

## 2022-06-14 DIAGNOSIS — R3915 Urgency of urination: Secondary | ICD-10-CM | POA: Diagnosis not present

## 2022-06-14 DIAGNOSIS — N138 Other obstructive and reflux uropathy: Secondary | ICD-10-CM | POA: Diagnosis not present

## 2022-06-14 DIAGNOSIS — R350 Frequency of micturition: Secondary | ICD-10-CM | POA: Diagnosis not present

## 2022-06-14 DIAGNOSIS — R3912 Poor urinary stream: Secondary | ICD-10-CM | POA: Diagnosis not present

## 2022-06-14 DIAGNOSIS — R338 Other retention of urine: Secondary | ICD-10-CM | POA: Diagnosis not present

## 2022-06-29 ENCOUNTER — Other Ambulatory Visit: Payer: Self-pay | Admitting: Cardiology

## 2022-07-27 ENCOUNTER — Other Ambulatory Visit: Payer: Self-pay | Admitting: Cardiology

## 2022-07-27 DIAGNOSIS — N138 Other obstructive and reflux uropathy: Secondary | ICD-10-CM | POA: Diagnosis not present

## 2022-08-04 ENCOUNTER — Other Ambulatory Visit: Payer: Self-pay | Admitting: Cardiology

## 2022-08-16 ENCOUNTER — Other Ambulatory Visit: Payer: Self-pay | Admitting: Cardiology

## 2022-09-09 DIAGNOSIS — R350 Frequency of micturition: Secondary | ICD-10-CM | POA: Diagnosis not present

## 2022-09-12 ENCOUNTER — Other Ambulatory Visit: Payer: Self-pay | Admitting: Cardiology

## 2022-09-19 ENCOUNTER — Telehealth: Payer: Self-pay | Admitting: Family Medicine

## 2022-09-19 DIAGNOSIS — I10 Essential (primary) hypertension: Secondary | ICD-10-CM

## 2022-09-19 MED ORDER — LISINOPRIL 10 MG PO TABS
10.0000 mg | ORAL_TABLET | Freq: Every day | ORAL | 0 refills | Status: DC
Start: 2022-09-19 — End: 2022-10-06

## 2022-09-19 NOTE — Telephone Encounter (Signed)
Pt has an appt 8/13 but does not have enough to last him.   Medication: lisinopril (ZESTRIL) 10 MG tablet  Has the patient contacted their pharmacy? Yes.     Preferred Pharmacy:   [id number] [place], [place] - [address] [place] [address] Phone: [phone]  Fax: [phone]

## 2022-09-19 NOTE — Telephone Encounter (Signed)
Sent in and patient informed 

## 2022-09-27 ENCOUNTER — Ambulatory Visit: Payer: Medicare Other | Admitting: Family Medicine

## 2022-10-06 ENCOUNTER — Ambulatory Visit (INDEPENDENT_AMBULATORY_CARE_PROVIDER_SITE_OTHER): Payer: Medicare Other | Admitting: Family Medicine

## 2022-10-06 ENCOUNTER — Encounter: Payer: Self-pay | Admitting: Family Medicine

## 2022-10-06 VITALS — BP 128/82 | HR 68 | Temp 98.2°F | Ht 75.0 in | Wt 194.4 lb

## 2022-10-06 DIAGNOSIS — N401 Enlarged prostate with lower urinary tract symptoms: Secondary | ICD-10-CM

## 2022-10-06 DIAGNOSIS — R413 Other amnesia: Secondary | ICD-10-CM | POA: Diagnosis not present

## 2022-10-06 DIAGNOSIS — J4530 Mild persistent asthma, uncomplicated: Secondary | ICD-10-CM | POA: Diagnosis not present

## 2022-10-06 DIAGNOSIS — Z125 Encounter for screening for malignant neoplasm of prostate: Secondary | ICD-10-CM | POA: Diagnosis not present

## 2022-10-06 DIAGNOSIS — I10 Essential (primary) hypertension: Secondary | ICD-10-CM | POA: Diagnosis not present

## 2022-10-06 DIAGNOSIS — R3912 Poor urinary stream: Secondary | ICD-10-CM | POA: Diagnosis not present

## 2022-10-06 DIAGNOSIS — E782 Mixed hyperlipidemia: Secondary | ICD-10-CM | POA: Diagnosis not present

## 2022-10-06 LAB — COMPREHENSIVE METABOLIC PANEL
ALT: 11 U/L (ref 0–53)
AST: 17 U/L (ref 0–37)
Albumin: 4.2 g/dL (ref 3.5–5.2)
Alkaline Phosphatase: 64 U/L (ref 39–117)
BUN: 16 mg/dL (ref 6–23)
CO2: 29 meq/L (ref 19–32)
Calcium: 9.3 mg/dL (ref 8.4–10.5)
Chloride: 106 meq/L (ref 96–112)
Creatinine, Ser: 1.24 mg/dL (ref 0.40–1.50)
GFR: 62.39 mL/min (ref >60.00)
Glucose, Bld: 97 mg/dL (ref 70–99)
Potassium: 4.1 meq/L (ref 3.5–5.1)
Sodium: 143 meq/L (ref 135–145)
Total Bilirubin: 0.6 mg/dL (ref 0.2–1.2)
Total Protein: 6.9 g/dL (ref 6.0–8.3)

## 2022-10-06 LAB — CBC
HCT: 44.2 % (ref 39.0–52.0)
Hemoglobin: 14.5 g/dL (ref 13.0–17.0)
MCHC: 32.8 g/dL (ref 30.0–36.0)
MCV: 96.1 fl (ref 78.0–100.0)
Platelets: 130 10*3/uL — ABNORMAL LOW (ref 150.0–400.0)
RBC: 4.6 Mil/uL (ref 4.22–5.81)
RDW: 13.7 % (ref 11.5–15.5)
WBC: 4.7 10*3/uL (ref 4.0–10.5)

## 2022-10-06 LAB — LIPID PANEL
Cholesterol: 164 mg/dL (ref 0–200)
HDL: 49.6 mg/dL (ref >39.00)
LDL Cholesterol: 96 mg/dL (ref 0–99)
NonHDL: 114.33
Total CHOL/HDL Ratio: 3
Triglycerides: 91 mg/dL (ref 0.0–149.0)
VLDL: 18.2 mg/dL (ref 0.0–40.0)

## 2022-10-06 MED ORDER — EPINEPHRINE 0.3 MG/0.3ML IJ SOAJ: 0.3000 mg | INTRAMUSCULAR | 1 refills | Status: AC | PRN

## 2022-10-06 MED ORDER — LISINOPRIL 10 MG PO TABS: 10.0000 mg | ORAL_TABLET | Freq: Every day | ORAL | 0 refills | Status: DC

## 2022-10-06 MED ORDER — ROSUVASTATIN CALCIUM 10 MG PO TABS: 10.0000 mg | ORAL_TABLET | Freq: Every day | ORAL | 3 refills | Status: DC

## 2022-10-06 MED ORDER — MONTELUKAST SODIUM 10 MG PO TABS: 10.0000 mg | ORAL_TABLET | Freq: Every day | ORAL | 2 refills | Status: DC

## 2022-10-06 MED ORDER — ALBUTEROL SULFATE HFA 108 (90 BASE) MCG/ACT IN AERS
INHALATION_SPRAY | RESPIRATORY_TRACT | 2 refills | Status: AC
Start: 2022-10-06 — End: ?

## 2022-10-06 MED ORDER — TAMSULOSIN HCL 0.4 MG PO CAPS: 0.4000 mg | ORAL_CAPSULE | Freq: Every day | ORAL | 3 refills | Status: DC

## 2022-10-06 NOTE — Progress Notes (Signed)
Chief Complaint  Patient presents with   Follow-up    Medication refills and labs today Needs an Epi pen    Subjective Justin Frazier is a 62 y.o. male who presents for hypertension follow up. He does monitor home blood pressures. Blood pressures ranging from 120's/80's on average. He is compliant with medications- lisinopril 10 mg/d. Patient has these side effects of medication: none He is usually adhering to a healthy diet overall. Current exercise: some wt lifting,  No CP or SOB.   Hyperlipidemia Patient presents for dyslipidemia follow up. Currently being treated with Crestor 10 mg daily and compliance with treatment thus far has been good. He denies myalgias. Diet/exercise as above.  The patient is not known to have coexisting coronary artery disease.  Asthma Patient has history of mild persistent asthma.  He uses albuterol sparingly.  He does take Singulair 10 mg daily.  He reports compliance and no adverse effects.  No recent wheezing, shortness of breath, coughing, steroid use, or hospitalization.  BPH Patient is taking Flomax 0.4 mg daily.  He reports compliance and no adverse effects.  He is urinating around once per night.  No daytime frequency.  Denies pain, fevers, bleeding, discharge.  Over the past few months, the patient is been having difficulty remembering things.  Mainly this dates, for instance forgetting appointments.  He gets around 7 hours of sleep nightly.  No stress, anxiety, or depression.  No trauma to his head.  He is eating and drinking normally.   Past Medical History:  Diagnosis Date   Abnormal EKG 03/20/2017   Adhesive capsulitis of left shoulder 07/26/2019   Allergic rhinitis    Apical variant hypertrophic cardiomyopathy (HCC) 05/27/2019   Benign essential hypertension 05/22/2020   Carpal tunnel syndrome    Central serous retinopathy    CKD (chronic kidney disease), stage I    DOE (dyspnea on exertion) 03/30/2017   Dry eye syndrome 12/22/2020    Enlarged lymph node 01/16/2019   Glaucoma    HTN (hypertension) 03/20/2017   Hyperlipidemia 01/20/2020   Left groin pain 01/20/2020   Low back pain 05/22/2020   Lumbar strain    LVH (left ventricular hypertrophy) 03/20/2017   Mild intermittent asthma without complication 07/10/2018   Mild persistent asthma without complication 06/19/2020   Myopia 12/22/2020   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 Concern over right parotid mass. See HPI. Examination reveals a mobile 1 cm mass in the immediate right preauricular area.  No surrounding adenopathy.  Head and neck is otherwise negative for nodes. PLAN: We discussed most tumors of the parotid gland are benign.  Needle biopsy is helpful to determine the nature.   NSVT (nonsustained ventricular tachycardia) (HCC)    Seen by Dr. Regino Schultze (EP) recommended beta blocker therapy (2014)   Onychomycosis 05/22/2020   Primary open angle glaucoma of both eyes 12/22/2020   Primary open-angle glaucoma, bilateral, severe stage 12/22/2020   Primary open-angle glaucoma, left eye, severe stage 12/22/2020   Proteinuria    Renal cyst, left 03/08/2018   Sciatica of left side    Thrombocytopenia (HCC) 02/01/2018   Tinea unguium 05/22/2020   Unintentional weight loss of more than 10 pounds 08/31/2018   Well adult exam 03/30/2017    Exam BP 128/82 (BP Location: Left Arm, Cuff Size: Normal)   Pulse 68   Temp 98.2 F (36.8 C) (Oral)   Ht 6\' 3"  (  1.905 m)   Wt 194 lb 6 oz (88.2 kg)   SpO2 99%   BMI 24.30 kg/m  General:  well developed, well nourished, in no apparent distress Heart: RRR, no bruits, no LE edema Lungs: clear to auscultation, no accessory muscle use Psych: well oriented with normal range of affect and appropriate judgment/insight  Benign essential hypertension - Plan: lisinopril (ZESTRIL) 10 MG tablet, CBC, Comprehensive metabolic panel, Lipid panel  Mixed hyperlipidemia  Mild  persistent asthma without complication - Plan: albuterol (VENTOLIN HFA) 108 (90 Base) MCG/ACT inhaler, montelukast (SINGULAIR) 10 MG tablet  Benign prostatic hyperplasia with weak urinary stream - Plan: tamsulosin (FLOMAX) 0.4 MG CAPS capsule  Memory change - Plan: TSH, B12  Screening for prostate cancer - Plan: PSA  Chronic, stable.  Continue lisinopril 10 mg daily.  Counseled on diet and exercise. Chronic, stable.  Continue Crestor 10 mg daily. Chronic, stable.  Continue singular 10 mg daily, albuterol as needed. Chronic, stable.  Continue Flomax 0.4 mg daily. New issue, will start the workup.  Check above labs.  If normal, will check an MRI of his brain.  If still normal will refer to neuropsychology. F/u in 6 mo. The patient voiced understanding and agreement to the plan.  Jilda Roche [place], DO 10/06/22  10:54 AM

## 2022-10-06 NOTE — Patient Instructions (Signed)
Give us 2-3 business days to get the results of your labs back.   Keep the diet clean and stay active.  I recommend getting the flu shot in mid October. This suggestion would change if the CDC comes out with a different recommendation.   Let us know if you need anything. 

## 2022-10-07 ENCOUNTER — Other Ambulatory Visit: Payer: Self-pay | Admitting: Family Medicine

## 2022-10-07 DIAGNOSIS — R972 Elevated prostate specific antigen [PSA]: Secondary | ICD-10-CM

## 2022-10-07 DIAGNOSIS — R413 Other amnesia: Secondary | ICD-10-CM

## 2022-10-07 LAB — VITAMIN B12: Vitamin B-12: >1501 pg/mL — ABNORMAL HIGH (ref 211–911)

## 2022-10-07 LAB — TSH: TSH: 1.61 u[IU]/mL (ref 0.35–5.50)

## 2022-10-07 LAB — PSA: PSA: 6.92 ng/mL — ABNORMAL HIGH (ref 0.10–4.00)

## 2022-10-19 ENCOUNTER — Ambulatory Visit (HOSPITAL_BASED_OUTPATIENT_CLINIC_OR_DEPARTMENT_OTHER)
Admission: RE | Admit: 2022-10-19 | Discharge: 2022-10-19 | Disposition: A | Discharge status: Home / Self Care | Payer: Medicare Other | Source: Ambulatory Visit | Attending: Family Medicine | Admitting: Family Medicine

## 2022-10-19 DIAGNOSIS — R413 Other amnesia: Secondary | ICD-10-CM | POA: Diagnosis not present

## 2022-11-04 ENCOUNTER — Other Ambulatory Visit: Payer: Self-pay | Admitting: Family Medicine

## 2022-11-04 ENCOUNTER — Encounter: Payer: Self-pay | Admitting: Physician Assistant

## 2022-11-04 DIAGNOSIS — R413 Other amnesia: Secondary | ICD-10-CM

## 2022-11-18 ENCOUNTER — Other Ambulatory Visit: Payer: Self-pay | Admitting: Family Medicine

## 2022-11-18 ENCOUNTER — Other Ambulatory Visit (INDEPENDENT_AMBULATORY_CARE_PROVIDER_SITE_OTHER): Payer: Medicare Other

## 2022-11-18 DIAGNOSIS — R972 Elevated prostate specific antigen [PSA]: Secondary | ICD-10-CM | POA: Diagnosis not present

## 2022-11-18 LAB — PSA: PSA: 7.65 ng/mL — ABNORMAL HIGH (ref 0.10–4.00)

## 2022-11-19 NOTE — Progress Notes (Signed)
Assessment/Plan:   Justin Frazier is a very pleasant 62 y.o. year old RH male with a history of hypertension, hyperlipidemia, asthma, BPH, HCM, glaucoma, seen today for evaluation of memory loss. MoCA today is 25/30.  Patient is able to participate on his IADLs and continues to drive without significant difficulties. MRI brain  10/2022 personally reviewed is of normal appearance and volume, no acute intracranial abnormalities. Etiology is unlikely of neurological etiology.   Subjective memory complaints    Continue to control mood as per PCP Recommend good control of cardiovascular risk factors Folllow up in 1 year to confirm that no worsening of memory is present   Subjective:   The patient is here alone   How long did patient have memory difficulties? For the last 6 months, especially with numbers or presenting new information "bu given time, I remember it". 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  Long-term memory is good. Sometimes he does crossword puzzles. He likes to play cards, read, movies. He likes to go [employer].  repeats oneself?  Endorsed Disoriented when walking into a room?  Patient denies except occasionally not remembering what patient came to the room for.    Leaving objects in unusual places? Denies.   Wandering behavior?  denies .  Any personality changes?  Denies. 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  Denies   Seizures?  Denies    Any sleep changes?   Sleeps well, reports vivid dreams "all my life", REM behavior or sleepwalking   Sleep apnea?  Denies   Any hygiene concerns?  Denies   Independent of bathing and dressing?  Endorsed  Does the patient needs help with medications? Patient is in charge, has a pill box   Who is in charge of the finances? Patient is in charge   Any changes in appetite?  Denies     Patient have trouble swallowing? Denies.    Does the patient cook? Yes   Any kitchen accidents such as leaving the stove on? Denies.   Any history of headaches?  Denies.   Chronic pain ? Has some back pain and takes tramadol prn with goo relief  Ambulates with difficulty?  Denies.   Recent falls or head injuries? Denies."When I was a child I hit my forehead with stiches"  Vision changes?  He has glaucoma since age 22, under control Unilateral weakness, numbness or tingling? Denies.   Any tremors?   Denies.   Any anosmia?  Denies.   Any incontinence of urine? He has to get up twice at night to urinate.  Any bowel dysfunction? Denies.      Patient lives with wife    History of heavy alcohol intake? Denies.   History of heavy tobacco use? Denies.   Family history of dementia? Mother had dementia, possible of vascular etiology. Does patient drive? Yes   Retired from [employer]  2011 and from the [employer].   Recent labs: TSH 1.62.  Past Medical History:  Diagnosis Date   Abnormal EKG 03/20/2017   Adhesive capsulitis of left shoulder 07/26/2019   Allergic rhinitis    Apical variant hypertrophic cardiomyopathy (HCC) 05/27/2019   Benign essential hypertension 05/22/2020   Carpal tunnel syndrome    Central serous retinopathy    CKD (chronic kidney disease), stage I    DOE (dyspnea on exertion) 03/30/2017  Dry eye syndrome 12/22/2020   Enlarged lymph node 01/16/2019   Glaucoma    HTN (hypertension) 03/20/2017   Hyperlipidemia 01/20/2020   Left groin pain 01/20/2020   Low back pain 05/22/2020   Lumbar strain    LVH (left ventricular hypertrophy) 03/20/2017   Mild intermittent asthma without complication 07/10/2018   Mild persistent asthma without complication 06/19/2020   Myopia 12/22/2020   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 Concern over right parotid mass. See HPI. Examination reveals a mobile 1 cm mass in the immediate right preauricular area.  No  surrounding adenopathy.  Head and neck is otherwise negative for nodes. PLAN: We discussed most tumors of the parotid gland are benign.  Needle biopsy is helpful to determine the nature.   NSVT (nonsustained ventricular tachycardia) (HCC)    Seen by Dr. Regino Schultze (EP) recommended beta blocker therapy (2014)   Onychomycosis 05/22/2020   Primary open angle glaucoma of both eyes 12/22/2020   Primary open-angle glaucoma, bilateral, severe stage 12/22/2020   Primary open-angle glaucoma, left eye, severe stage 12/22/2020   Proteinuria    Renal cyst, left 03/08/2018   Sciatica of left side    Thrombocytopenia (HCC) 02/01/2018   Tinea unguium 05/22/2020   Unintentional weight loss of more than 10 pounds 08/31/2018   Well adult exam 03/30/2017     Past Surgical History:  Procedure Laterality Date   COLONOSCOPY  06/11/2013     Allergies  Allergen Reactions   Brinzolamide-Brimonidine     Other reaction(s): Itching of eye, Red eye, Itching of eye, Red eye    Current Outpatient Medications  Medication Instructions   albuterol (VENTOLIN HFA) 108 (90 Base) MCG/ACT inhaler INHALE 1 PUFF INTO THE LUNGS EVERY 6 HOURS AS NEEDED FOR WHEEZING OR SHORTNESS OF BREATH.   aspirin EC 81 mg, Oral, Daily   Cholecalciferol 5,000 mg, Oral, Daily   cyanocobalamin (VITAMIN B12) 1,000 mcg, Oral, Daily   dorzolamide-timolol (COSOPT) 22.3-6.8 MG/ML ophthalmic solution 1 drop, Both Eyes, 2 times daily   EPINEPHrine (EPI-PEN) 0.3 mg, Intramuscular, As needed, As needed for severe allergic reaction then call 911   lisinopril (ZESTRIL) 10 mg, Oral, Daily   metoprolol succinate (TOPROL-XL) 25 mg, Oral, Daily   montelukast (SINGULAIR) 10 mg, Oral, Daily   RHOPRESSA 0.02 % SOLN 1 drop, Both Eyes, Daily   rosuvastatin (CRESTOR) 10 mg, Oral, Daily, Patient needs an appointment for further refills. 3 rd/final attempt   tamsulosin (FLOMAX) 0.4 mg, Oral, Daily   traMADol (ULTRAM) 50 MG tablet Take 2 tabs every 6 hours as needed for  moderate-severe pain. Not to exceed 4 tabs daily.   TRAVATAN Z 0.004 % SOLN ophthalmic solution 1 drop, Both Eyes, Nightly     VITALS:   Vitals:   11/24/22 0737  BP: (!) 148/82  Pulse: 65  Resp: 20  SpO2: 95%  Weight: 198 lb (89.8 kg)  Height: 6\' 3"  (1.905 m)      PHYSICAL EXAM   HEENT:  Normocephalic, atraumatic. The superficial temporal arteries are without ropiness or tenderness. Cardiovascular: Regular rate and rhythm. Very soft murmur  Lungs: Clear to auscultation bilaterally. Neck: There are no carotid bruits noted bilaterally.  NEUROLOGICAL:    11/24/2022    8:00 AM  Montreal Cognitive Assessment   Visuospatial/ Executive (0/5) 3  Naming (0/3) 3  Attention: Read list of digits (0/2) 2  Attention: Read list of letters (0/1)  1  Attention: Serial 7 subtraction starting at 100 (0/3) 1  Language: Repeat phrase (0/2) 2  Language : Fluency (0/1) 1  Abstraction (0/2) 1  Delayed Recall (0/5) 4  Orientation (0/6) 6  Total 24  Adjusted Score (based on education) 25        No data to display           Orientation:  Alert and oriented to person, place and time. No aphasia or dysarthria. Fund of knowledge is appropriate. Recent memory impaired and remote memory intact.  Attention and concentration are normal.  Able to name objects and repeat phrases. Delayed recall  4/5 Cranial nerves: There is good facial symmetry. Extraocular muscles are intact and visual fields are full to confrontational testing. Speech is fluent and clear. No tongue deviation. Hearing is intact to conversational tone. Tone: Tone is good throughout. Sensation: Sensation is intact to light touch. Vibration is intact at the bilateral big toe.  Coordination: The patient has no difficulty with RAM's or FNF bilaterally. Normal finger to nose  Motor: Strength is 5/5 in the bilateral upper and lower extremities. There is no pronator drift. There are no fasciculations noted. DTR's: Deep tendon reflexes are  2/4 bilaterally. Gait and Station: The patient is able to ambulate without difficulty The patient is able to heel toe walk . Gait is cautious and narrow. The patient is able to ambulate in a tandem fashion.       Thank you for allowing [place] the opportunity to participate in the care of this nice patient. Please do not hesitate to contact us for any questions or concerns.   Total time spent on today's visit was 61 minutes dedicated to this patient today, preparing to see patient, examining the patient, ordering tests and/or medications and counseling the patient, documenting clinical information in the EHR or other health record, independently interpreting results and communicating results to the patient/family, discussing treatment and goals, answering patient's questions and coordinating care.  Cc:  Sharlene Dory, DO  [facility] 11/24/2022 8:58 AM

## 2022-11-24 ENCOUNTER — Encounter: Payer: Self-pay | Admitting: Physician Assistant

## 2022-11-24 ENCOUNTER — Ambulatory Visit: Payer: Medicare Other

## 2022-11-24 ENCOUNTER — Ambulatory Visit (INDEPENDENT_AMBULATORY_CARE_PROVIDER_SITE_OTHER): Payer: Medicare Other | Admitting: Physician Assistant

## 2022-11-24 VITALS — BP 154/82 | HR 65 | Resp 20 | Ht 75.0 in | Wt 198.0 lb

## 2022-11-24 DIAGNOSIS — R4189 Other symptoms and signs involving cognitive functions and awareness: Secondary | ICD-10-CM | POA: Diagnosis not present

## 2022-11-24 NOTE — Patient Instructions (Signed)
It was a pleasure to see you today at our office.   Recommendations:   Follow up in 1 year    For psychiatric meds, mood meds: Please have your primary care physician manage these medications.  If you have any severe symptoms of a stroke, or other severe issues such as confusion,severe chills or fever, etc call 911 or go to the ER as you may need to be evaluated further     Whom to call: Memory  decline, memory medications: Call our office [phone]    [email or web address]   RECOMMENDATIONS FOR ALL PATIENTS WITH MEMORY PROBLEMS: 1. Continue to exercise (Recommend 30 minutes of walking everyday, or 3 hours every week) 2. Increase social interactions - continue going to [place] and enjoy social gatherings with friends and family 3. Eat healthy, avoid fried foods and eat more fruits and vegetables 4. Maintain adequate blood pressure, blood sugar, and blood cholesterol level. Reducing the risk of stroke and cardiovascular disease also helps promoting better memory. 5. Avoid stressful situations. Live a simple life and avoid aggravations. Organize your time and prepare for the next day in anticipation. 6. Sleep well, avoid any interruptions of sleep and avoid any distractions in the bedroom that may interfere with adequate sleep quality 7. Avoid sugar, avoid sweets as there is a strong link between excessive sugar intake, diabetes, and cognitive impairment We discussed the Mediterranean diet, which has been shown to help patients reduce the risk of progressive memory disorders and reduces cardiovascular risk. This includes eating fish, eat fruits and green leafy vegetables, nuts like almonds and hazelnuts, walnuts, and also use olive oil. Avoid fast foods and fried foods as much as possible. Avoid sweets and sugar as sugar use has been linked to worsening of memory function.  There is always a concern of gradual progression of memory  problems. If this is the case, then we may need to adjust level of care according to patient needs. Support, both to the patient and caregiver, should then be put into place.         DRIVING: Regarding driving, in patients with progressive memory problems, driving will be impaired. We advise to have someone else do the driving if trouble finding directions or if minor accidents are reported. Independent driving assessment is available to determine safety of driving.   If you are interested in the driving assessment, you can contact the following:  [facility] [employer] [facility] in [place] [phone]  [facility] [phone]  [facility] [phone]  [facility] [phone] or [phone]   FALL PRECAUTIONS: Be cautious when walking. Scan the area for obstacles that may increase the risk of trips and falls. When getting up in the mornings, sit up at the edge of the bed for a few minutes before getting out of bed. Consider elevating the bed at the head end to avoid drop of blood pressure when getting up. Walk always in a well-lit room (use night lights in the walls). Avoid area rugs or power cords from appliances in the middle of the walkways. Use a walker or a cane if necessary and consider physical therapy for balance exercise. Get your eyesight checked regularly.  FINANCIAL OVERSIGHT: Supervision, especially oversight when making financial decisions or transactions is also recommended.  HOME SAFETY: Consider the safety of the kitchen when operating appliances like stoves, microwave oven, and blender. Consider having supervision and share cooking responsibilities until no longer able to participate in those. Accidents with firearms and other hazards in the  house should be identified and addressed as well.   ABILITY TO BE LEFT ALONE: If patient is unable to contact 911 operator, consider using LifeLine, or when the need is there, arrange for someone to  stay with patients. Smoking is a fire hazard, consider supervision or cessation. Risk of wandering should be assessed by caregiver and if detected at any point, supervision and safe proof recommendations should be instituted.  MEDICATION SUPERVISION: Inability to self-administer medication needs to be constantly addressed. Implement a mechanism to ensure safe administration of the medications.      Mediterranean Diet A Mediterranean diet refers to food and lifestyle choices that are based on the traditions of countries located on the [place]. This way of eating has been shown to help prevent certain conditions and improve outcomes for people who have chronic diseases, like kidney disease and heart disease. What are tips for following this plan? Lifestyle  Cook and eat meals together with your family, when possible. Drink enough fluid to keep your urine clear or pale yellow. Be physically active every day. This includes: Aerobic exercise like running or swimming. Leisure activities like gardening, walking, or housework. Get 7-8 hours of sleep each night. If recommended by your health care provider, drink red wine in moderation. This means 1 glass a day for nonpregnant women and 2 glasses a day for men. A glass of wine equals 5 oz (150 mL). Reading food labels  Check the serving size of packaged foods. For foods such as rice and pasta, the serving size refers to the amount of cooked product, not dry. Check the total fat in packaged foods. Avoid foods that have saturated fat or trans fats. Check the ingredients list for added sugars, such as corn syrup. Shopping  At the grocery store, buy most of your food from the areas near the walls of the store. This includes: Fresh fruits and vegetables (produce). Grains, beans, nuts, and seeds. Some of these may be available in unpackaged forms or large amounts (in bulk). Fresh seafood. Poultry and eggs. Low-fat dairy products. Buy whole  ingredients instead of prepackaged foods. Buy fresh fruits and vegetables in-season from local farmers markets. Buy frozen fruits and vegetables in resealable bags. If you do not have access to quality fresh seafood, buy precooked frozen shrimp or canned fish, such as tuna, salmon, or sardines. Buy small amounts of raw or cooked vegetables, salads, or olives from the deli or salad bar at your store. Stock your pantry so you always have certain foods on hand, such as olive oil, canned tuna, canned tomatoes, rice, pasta, and beans. Cooking  Cook foods with extra-virgin olive oil instead of using butter or other vegetable oils. Have meat as a side dish, and have vegetables or grains as your main dish. This means having meat in small portions or adding small amounts of meat to foods like pasta or stew. Use beans or vegetables instead of meat in common dishes like chili or lasagna. Experiment with different cooking methods. Try roasting or broiling vegetables instead of steaming or sauteing them. Add frozen vegetables to soups, stews, pasta, or rice. Add nuts or seeds for added healthy fat at each meal. You can add these to yogurt, salads, or vegetable dishes. Marinate fish or vegetables using olive oil, lemon juice, garlic, and fresh herbs. Meal planning  Plan to eat 1 vegetarian meal one day each week. Try to work up to 2 vegetarian meals, if possible. Eat seafood 2 or more times a week.  Have healthy snacks readily available, such as: Vegetable sticks with hummus. Greek yogurt. Fruit and nut trail mix. Eat balanced meals throughout the week. This includes: Fruit: 2-3 servings a day Vegetables: 4-5 servings a day Low-fat dairy: 2 servings a day Fish, poultry, or lean meat: 1 serving a day Beans and legumes: 2 or more servings a week Nuts and seeds: 1-2 servings a day Whole grains: 6-8 servings a day Extra-virgin olive oil: 3-4 servings a day Limit red meat and sweets to only a few servings  a month What are my food choices? Mediterranean diet Recommended Grains: Whole-grain pasta. Brown rice. Bulgar wheat. Polenta. Couscous. Whole-wheat bread. Orpah Cobb. Vegetables: Artichokes. Beets. Broccoli. Cabbage. Carrots. Eggplant. Green beans. Chard. Kale. Spinach. Onions. Leeks. Peas. Squash. Tomatoes. Peppers. Radishes. Fruits: Apples. Apricots. Avocado. Berries. Bananas. Cherries. Dates. Figs. Grapes. Lemons. Melon. Oranges. Peaches. Plums. Pomegranate. Meats and other protein foods: Beans. Almonds. Sunflower seeds. Pine nuts. Peanuts. Cod. Salmon. Scallops. Shrimp. Tuna. Tilapia. Clams. Oysters. Eggs. Dairy: Low-fat milk. Cheese. Greek yogurt. Beverages: Water. Red wine. Herbal tea. Fats and oils: Extra virgin olive oil. Avocado oil. Grape seed oil. Sweets and desserts: [place] yogurt with honey. Baked apples. Poached pears. Trail mix. Seasoning and other foods: Basil. Cilantro. Coriander. Cumin. Mint. Parsley. Sage. Rosemary. Tarragon. Garlic. Oregano. Thyme. Pepper. Balsalmic vinegar. Tahini. Hummus. Tomato sauce. Olives. Mushrooms. Limit these Grains: Prepackaged pasta or rice dishes. Prepackaged cereal with added sugar. Vegetables: Deep fried potatoes (french fries). Fruits: Fruit canned in syrup. Meats and other protein foods: Beef. Pork. Lamb. Poultry with skin. Hot dogs. Tomasa Blase. Dairy: Ice cream. Sour cream. Whole milk. Beverages: Juice. Sugar-sweetened soft drinks. Beer. Liquor and spirits. Fats and oils: Butter. Canola oil. Vegetable oil. Beef fat (tallow). Lard. Sweets and desserts: Cookies. Cakes. Pies. Candy. Seasoning and other foods: Mayonnaise. Premade sauces and marinades. The items listed may not be a complete list. Talk with your dietitian about what dietary choices are right for you. Summary The Mediterranean diet includes both food and lifestyle choices. Eat a variety of fresh fruits and vegetables, beans, nuts, seeds, and whole grains. Limit the amount of  red meat and sweets that you eat. Talk with your health care provider about whether it is safe for you to drink red wine in moderation. This means 1 glass a day for nonpregnant women and 2 glasses a day for men. A glass of wine equals 5 oz (150 mL). This information is not intended to replace advice given to you by your health care provider. Make sure you discuss any questions you have with your health care provider. Document Released: 09/24/2015 Document Revised: 10/27/2015 Document Reviewed: 09/24/2015 Elsevier Interactive Patient Education  2017 [employer].

## 2022-12-01 ENCOUNTER — Telehealth: Payer: Self-pay | Admitting: Family Medicine

## 2022-12-01 ENCOUNTER — Other Ambulatory Visit: Payer: Self-pay | Admitting: Family Medicine

## 2022-12-01 DIAGNOSIS — N401 Enlarged prostate with lower urinary tract symptoms: Secondary | ICD-10-CM

## 2022-12-01 NOTE — Telephone Encounter (Signed)
I don't recall or have documented that we told him that. Per our last note, he was urinating nightly on 1 tab daily. Did he start taking 2 and did better?

## 2022-12-01 NOTE — Telephone Encounter (Signed)
Pharmacy states the pt was under the impression he takes 2 tablets daily. Please advise and change script.    tamsulosin (FLOMAX) 0.4 MG CAPS capsule   [id number] - [place],  - [address] [place] [address] Phone: [phone]  Fax: [phone]

## 2022-12-02 MED ORDER — TAMSULOSIN HCL 0.4 MG PO CAPS
0.8000 mg | ORAL_CAPSULE | Freq: Every day | ORAL | 3 refills | Status: DC
Start: 2022-12-02 — End: 2023-11-24

## 2022-12-02 NOTE — Telephone Encounter (Signed)
Patient informed script sent in. 

## 2022-12-02 NOTE — Telephone Encounter (Signed)
I spoke to him and he said his urologist told him to take 2. He would like the prescription to say take 2----he is completely out now.

## 2022-12-14 ENCOUNTER — Ambulatory Visit: Payer: Medicare Other | Admitting: Cardiology

## 2022-12-27 ENCOUNTER — Other Ambulatory Visit: Payer: Self-pay

## 2022-12-27 DIAGNOSIS — J45909 Unspecified asthma, uncomplicated: Secondary | ICD-10-CM

## 2022-12-27 DIAGNOSIS — H543 Unqualified visual loss, both eyes: Secondary | ICD-10-CM | POA: Insufficient documentation

## 2022-12-27 DIAGNOSIS — N138 Other obstructive and reflux uropathy: Secondary | ICD-10-CM | POA: Diagnosis not present

## 2022-12-27 HISTORY — DX: Unqualified visual loss, both eyes: H54.3

## 2022-12-27 HISTORY — DX: Unspecified asthma, uncomplicated: J45.909

## 2022-12-28 ENCOUNTER — Ambulatory Visit: Payer: Medicare Other | Attending: Cardiology | Admitting: Cardiology

## 2022-12-28 ENCOUNTER — Encounter: Payer: Self-pay | Admitting: Cardiology

## 2022-12-28 VITALS — BP 124/68 | HR 72 | Ht 75.0 in | Wt 200.2 lb

## 2022-12-28 DIAGNOSIS — I422 Other hypertrophic cardiomyopathy: Secondary | ICD-10-CM | POA: Diagnosis not present

## 2022-12-28 DIAGNOSIS — E782 Mixed hyperlipidemia: Secondary | ICD-10-CM

## 2022-12-28 DIAGNOSIS — I10 Essential (primary) hypertension: Secondary | ICD-10-CM

## 2022-12-28 NOTE — Patient Instructions (Signed)

## 2022-12-28 NOTE — Progress Notes (Signed)
Cardiology Office Note:    Date:  12/28/2022   ID:  Justin Frazier, DOB 1960/06/18, MRN [id number]  PCP:  Sharlene Dory, DO  Cardiologist:  Garwin Brothers, MD   Referring MD: Sharlene Dory*    ASSESSMENT:    1. Mixed hyperlipidemia   2. Apical variant hypertrophic cardiomyopathy (HCC)   3. Benign essential hypertension    PLAN:    In order of problems listed above:  Primary prevention stressed with the patient.  Importance of compliance with diet medication stressed and patient verbalized standing. Apical hypertrophic cardiomyopathy: Stable at this time.  Asymptomatic.  I discussed this with him at length.  He has been evaluated by electrophysiology colleagues in the past. Essential hypertension: Blood pressure stable and diet was emphasized.  Lifestyle modification urged. Mixed dyslipidemia: On lipid-lowering medications followed by primary care. Patient will be seen in follow-up appointment in 6 months or earlier if the patient has any concerns.    Medication Adjustments/Labs and Tests Ordered: Current medicines are reviewed at length with the patient today.  Concerns regarding medicines are outlined above.  Orders Placed This Encounter  Procedures   EKG 12-Lead   No orders of the defined types were placed in this encounter.    No chief complaint on file.    History of Present Illness:    Justin Frazier is a 62 y.o. male.  Patient has past medical history of apical hypertrophic cardiomyopathy.  He has history of essential hypertension.  He denies any problems at this time and takes care of activities of daily living.  He exercises on a regular basis.  He complains of occasional sharp chest pain like feelings not related to exertion.  He has history of mixed dyslipidemia.  No palpitations or syncope.  At the time of my evaluation, the patient is alert awake oriented and in no distress.  Past Medical History:  Diagnosis Date   Abnormal EKG  03/20/2017   Adhesive capsulitis of left shoulder 07/26/2019   Allergic rhinitis    Apical variant hypertrophic cardiomyopathy (HCC) 05/27/2019   Asthma, currently active 12/27/2022   Benign essential hypertension 05/22/2020   Benign prostatic hyperplasia with weak urinary stream 09/03/2021   Carpal tunnel syndrome    Central serous retinopathy    CKD (chronic kidney disease), stage I    DOE (dyspnea on exertion) 03/30/2017   Dry eye syndrome 12/22/2020   Enlarged lymph node 01/16/2019   Glaucoma    HTN (hypertension) 03/20/2017   Hyperlipidemia 01/20/2020   Left groin pain 01/20/2020   Low back pain 05/22/2020   Lumbar strain    LVH (left ventricular hypertrophy) 03/20/2017   Mild intermittent asthma without complication 07/10/2018   Mild persistent asthma without complication 06/19/2020   Myopia 12/22/2020   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 Concern over right parotid mass. See HPI. Examination reveals a mobile 1 cm mass in the immediate right preauricular area.  No surrounding adenopathy.  Head and neck is otherwise negative for nodes. PLAN: We discussed most tumors of the parotid gland are benign.  Needle biopsy is helpful to determine the nature.   NSVT (nonsustained ventricular tachycardia) (HCC)    Seen by Dr. Regino Schultze (EP) recommended beta blocker therapy (2014)   Onychomycosis 05/22/2020   Primary open angle glaucoma of both eyes 12/22/2020   Primary open-angle glaucoma, bilateral, severe stage 12/22/2020   Primary open-angle  glaucoma, left eye, severe stage 12/22/2020   Proteinuria    Renal cyst, left 03/08/2018   Sciatica of left side    Thrombocytopenia (HCC) 02/01/2018   Tinea unguium 05/22/2020   Unintentional weight loss of more than 10 pounds 08/31/2018   Visual loss, both eyes unqualified 12/27/2022   Well adult exam 03/30/2017    Past Surgical History:  Procedure  Laterality Date   COLONOSCOPY  06/11/2013    Current Medications: Current Meds  Medication Sig   albuterol (VENTOLIN HFA) 108 (90 Base) MCG/ACT inhaler INHALE 1 PUFF INTO THE LUNGS EVERY 6 HOURS AS NEEDED FOR WHEEZING OR SHORTNESS OF BREATH.   aspirin EC 81 MG tablet Take 81 mg by mouth daily.    Cholecalciferol 5000 units TABS Take 5,000 mg by mouth daily.   dorzolamide-timolol (COSOPT) 22.3-6.8 MG/ML ophthalmic solution Place 1 drop into both eyes 2 (two) times daily.   EPINEPHrine 0.3 mg/0.3 mL IJ SOAJ injection Inject 0.3 mg into the muscle as needed for anaphylaxis. As needed for severe allergic reaction then call 911   lisinopril (ZESTRIL) 10 MG tablet Take 1 tablet (10 mg total) by mouth daily.   metoprolol succinate (TOPROL-XL) 25 MG 24 hr tablet Take 25 mg by mouth daily.   montelukast (SINGULAIR) 10 MG tablet Take 1 tablet (10 mg total) by mouth daily.   RHOPRESSA 0.02 % SOLN Place 1 drop into both eyes daily.   rosuvastatin (CRESTOR) 10 MG tablet Take 1 tablet (10 mg total) by mouth daily. Patient needs an appointment for further refills. 3 rd/final attempt   solifenacin (VESICARE) 10 MG tablet Take 10 mg by mouth daily.   tadalafil (CIALIS) 5 MG tablet Take 5 mg by mouth daily as needed for erectile dysfunction.   tamsulosin (FLOMAX) 0.4 MG CAPS capsule Take 2 capsules (0.8 mg total) by mouth daily.   traMADol (ULTRAM) 50 MG tablet Take 2 tabs every 6 hours as needed for moderate-severe pain. Not to exceed 4 tabs daily.   TRAVATAN Z 0.004 % SOLN ophthalmic solution Place 1 drop into both eyes at bedtime.   vitamin B-12 (CYANOCOBALAMIN) 1000 MCG tablet Take 1,000 mcg by mouth daily.     Allergies:   Brinzolamide-brimonidine   Social History   Socioeconomic History   Marital status: Married    Spouse name: Not on file   Number of children: 2   Years of education: 12   Highest education level: Not on file  Occupational History   Not on file  Tobacco Use   Smoking  status: Never   Smokeless tobacco: Never  Vaping Use   Vaping status: Never Used  Substance and Sexual Activity   Alcohol use: No   Drug use: Yes   Sexual activity: Not Currently  Other Topics Concern   Not on file  Social History Narrative   Right handed   Drinks caffeine   Lives with wife   2 story home   Retired marine 2011   Social Determinants of Health   Financial Resource Strain: Low Risk  (10/09/2020)   Overall Financial Resource Strain (CARDIA)    Difficulty of Paying Living Expenses: Not hard at all  Food Insecurity: No Food Insecurity (10/09/2020)   Hunger Vital Sign    Worried About Running Out of Food in the Last Year: Never true    Ran Out of Food in the Last Year: Never true  Transportation Needs: No Transportation Needs (10/09/2020)   PRAPARE - Transportation    Lack  of Transportation (Medical): No    Lack of Transportation (Non-Medical): No  Physical Activity: Insufficiently Active (10/09/2020)   Exercise Vital Sign    Days of Exercise per Week: 2 days    Minutes of Exercise per Session: 30 min  Stress: No Stress Concern Present (10/09/2020)   [employer] of Occupational Health - Occupational Stress Questionnaire    Feeling of Stress : Not at all  Social Connections: Moderately Integrated (10/09/2020)   Social Connection and Isolation Panel [NHANES]    Frequency of Communication with Friends and Family: More than three times a week    Frequency of Social Gatherings with Friends and Family: More than three times a week    Attends Religious Services: More than 4 times per year    Active Member of [employer] or Organizations: No    Attends Banker Meetings: Never    Marital Status: Married     Family History: The patient's family history includes Alzheimer's disease in his mother; Hypertension in his father; Liver cancer in his father.  ROS:   Please see the history of present illness.    All other systems reviewed and are  negative.  EKGs/Labs/Other Studies Reviewed:    The following studies were reviewed today: .Marland KitchenEKG Interpretation Date/Time:  Wednesday December 28 2022 15:06:39 EST Ventricular Rate:  73 PR Interval:  172 QRS Duration:  102 QT Interval:  388 QTC Calculation: 427 R Axis:   -6  Text Interpretation: Sinus rhythm with Premature supraventricular complexes Left ventricular hypertrophy with repolarization abnormality Abnormal ECG No previous ECGs available Confirmed by Belva Crome [phone]) on 12/28/2022 2:26:12 PM     Recent Labs: 10/06/2022: ALT 11; BUN 16; Creatinine, Ser 1.24; Hemoglobin 14.5; Platelets 130.0; Potassium 4.1; Sodium 143; TSH 1.61  Recent Lipid Panel    Component Value Date/Time   CHOL 164 10/06/2022 0820   CHOL 124 06/21/2021 0835   TRIG 91.0 10/06/2022 0820   HDL 49.60 10/06/2022 0820   HDL 55 06/21/2021 0835   CHOLHDL 3 10/06/2022 0820   VLDL 18.2 10/06/2022 0820   LDLCALC 96 10/06/2022 0820   LDLCALC 55 06/21/2021 0835    Physical Exam:    VS:  BP 124/68   Pulse 72   Ht 6\' 3"  (1.905 m)   Wt 200 lb 3.2 oz (90.8 kg)   SpO2 98%   BMI 25.02 kg/m     Wt Readings from Last 3 Encounters:  12/28/22 200 lb 3.2 oz (90.8 kg)  11/24/22 198 lb (89.8 kg)  10/06/22 194 lb 6 oz (88.2 kg)     GEN: Patient is in no acute distress HEENT: Normal NECK: No JVD; No carotid bruits LYMPHATICS: No lymphadenopathy CARDIAC: Hear sounds regular, 2/6 systolic murmur at the apex. RESPIRATORY:  Clear to auscultation without rales, wheezing or rhonchi  ABDOMEN: Soft, non-tender, non-distended MUSCULOSKELETAL:  No edema; No deformity  SKIN: Warm and dry NEUROLOGIC:  Alert and oriented x 3 PSYCHIATRIC:  Normal affect   Signed, Garwin Brothers, MD  12/28/2022 2:40 PM    [facility]

## 2023-01-25 ENCOUNTER — Encounter: Payer: Self-pay | Admitting: Family Medicine

## 2023-01-25 ENCOUNTER — Ambulatory Visit: Payer: Medicare Other | Admitting: Family Medicine

## 2023-01-25 VITALS — BP 138/86 | HR 55 | Temp 98.0°F | Resp 16 | Ht 75.0 in | Wt 200.6 lb

## 2023-01-25 DIAGNOSIS — Z0000 Encounter for general adult medical examination without abnormal findings: Secondary | ICD-10-CM | POA: Diagnosis not present

## 2023-01-25 DIAGNOSIS — Z125 Encounter for screening for malignant neoplasm of prostate: Secondary | ICD-10-CM | POA: Diagnosis not present

## 2023-01-25 DIAGNOSIS — I10 Essential (primary) hypertension: Secondary | ICD-10-CM

## 2023-01-25 LAB — LIPID PANEL
Cholesterol: 137 mg/dL (ref 0–200)
HDL: 52.2 mg/dL (ref >39.00)
LDL Cholesterol: 70 mg/dL (ref 0–99)
NonHDL: 84.5
Total CHOL/HDL Ratio: 3
Triglycerides: 75 mg/dL (ref 0.0–149.0)
VLDL: 15 mg/dL (ref 0.0–40.0)

## 2023-01-25 LAB — CBC
HCT: 44.4 % (ref 39.0–52.0)
Hemoglobin: 14.5 g/dL (ref 13.0–17.0)
MCHC: 32.6 g/dL (ref 30.0–36.0)
MCV: 97.4 fL (ref 78.0–100.0)
Platelets: 129 10*3/uL — ABNORMAL LOW (ref 150.0–400.0)
RBC: 4.56 Mil/uL (ref 4.22–5.81)
RDW: 13.9 % (ref 11.5–15.5)
WBC: 4.7 10*3/uL (ref 4.0–10.5)

## 2023-01-25 LAB — COMPREHENSIVE METABOLIC PANEL
ALT: 12 U/L (ref 0–53)
AST: 17 U/L (ref 0–37)
Albumin: 4.2 g/dL (ref 3.5–5.2)
Alkaline Phosphatase: 69 U/L (ref 39–117)
BUN: 17 mg/dL (ref 6–23)
CO2: 26 meq/L (ref 19–32)
Calcium: 8.9 mg/dL (ref 8.4–10.5)
Chloride: 108 meq/L (ref 96–112)
Creatinine, Ser: 1.28 mg/dL (ref 0.40–1.50)
GFR: 59.92 mL/min — ABNORMAL LOW (ref >60.00)
Glucose, Bld: 94 mg/dL (ref 70–99)
Potassium: 4 meq/L (ref 3.5–5.1)
Sodium: 142 meq/L (ref 135–145)
Total Bilirubin: 0.8 mg/dL (ref 0.2–1.2)
Total Protein: 6.8 g/dL (ref 6.0–8.3)

## 2023-01-25 LAB — PSA, MEDICARE: PSA: 6.19 ng/mL — ABNORMAL HIGH (ref 0.10–4.00)

## 2023-01-25 MED ORDER — LISINOPRIL 10 MG PO TABS: 10.0000 mg | ORAL_TABLET | Freq: Every day | ORAL | 3 refills | Status: DC

## 2023-01-25 NOTE — Progress Notes (Signed)
Chief Complaint  Patient presents with   Annual Exam    Annual Exam    Well Male Justin Frazier is here for a complete physical.   His last physical was >1 year ago.  Current diet: in general, diet is fair.  Current exercise: walking, lifting wts Weight trend: up a little Fatigue out of ordinary? No. Seat belt? Yes.   Advanced directive? Yes  Health maintenance Shingrix- No Colonoscopy- Yes Tetanus- Yes HIV- Yes Hep C- Yes   Past Medical History:  Diagnosis Date   Abnormal EKG 03/20/2017   Adhesive capsulitis of left shoulder 07/26/2019   Allergic rhinitis    Apical variant hypertrophic cardiomyopathy (HCC) 05/27/2019   Asthma, currently active 12/27/2022   Benign essential hypertension 05/22/2020   Benign prostatic hyperplasia with weak urinary stream 09/03/2021   Carpal tunnel syndrome    Central serous retinopathy    CKD (chronic kidney disease), stage I    DOE (dyspnea on exertion) 03/30/2017   Dry eye syndrome 12/22/2020   Enlarged lymph node 01/16/2019   Glaucoma    HTN (hypertension) 03/20/2017   Hyperlipidemia 01/20/2020   Left groin pain 01/20/2020   Low back pain 05/22/2020   Lumbar strain    LVH (left ventricular hypertrophy) 03/20/2017   Mild intermittent asthma without complication 07/10/2018   Mild persistent asthma without complication 06/19/2020   Myopia 12/22/2020   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 Concern over right parotid mass. See HPI. Examination reveals a mobile 1 cm mass in the immediate right preauricular area.  No surrounding adenopathy.  Head and neck is otherwise negative for nodes. PLAN: We discussed most tumors of the parotid gland are benign.  Needle biopsy is helpful to determine the nature.   NSVT (nonsustained ventricular tachycardia) (HCC)    Seen by Dr. Regino Schultze (EP) recommended beta blocker therapy (2014)   Onychomycosis 05/22/2020    Primary open angle glaucoma of both eyes 12/22/2020   Primary open-angle glaucoma, bilateral, severe stage 12/22/2020   Primary open-angle glaucoma, left eye, severe stage 12/22/2020   Proteinuria    Renal cyst, left 03/08/2018   Sciatica of left side    Thrombocytopenia (HCC) 02/01/2018   Tinea unguium 05/22/2020   Unintentional weight loss of more than 10 pounds 08/31/2018   Visual loss, both eyes unqualified 12/27/2022   Well adult exam 03/30/2017      Past Surgical History:  Procedure Laterality Date   COLONOSCOPY  06/11/2013    Medications  Current Outpatient Medications on File Prior to Visit  Medication Sig Dispense Refill   albuterol (VENTOLIN HFA) 108 (90 Base) MCG/ACT inhaler INHALE 1 PUFF INTO THE LUNGS EVERY 6 HOURS AS NEEDED FOR WHEEZING OR SHORTNESS OF BREATH. 8.5 each 2   aspirin EC 81 MG tablet Take 81 mg by mouth daily.      Cholecalciferol 5000 units TABS Take 5,000 mg by mouth daily.     dorzolamide-timolol (COSOPT) 22.3-6.8 MG/ML ophthalmic solution Place 1 drop into both eyes 2 (two) times daily.     EPINEPHrine 0.3 mg/0.3 mL IJ SOAJ injection Inject 0.3 mg into the muscle as needed for anaphylaxis. As needed for severe allergic reaction then call 911 2 each 1   metoprolol succinate (TOPROL-XL) 25 MG 24 hr tablet Take 25 mg by mouth daily.     montelukast (SINGULAIR) 10 MG tablet Take 1 tablet (10 mg total) by mouth daily.  90 tablet 2   RHOPRESSA 0.02 % SOLN Place 1 drop into both eyes daily.     rosuvastatin (CRESTOR) 10 MG tablet Take 1 tablet (10 mg total) by mouth daily. Patient needs an appointment for further refills. 3 rd/final attempt 90 tablet 3   solifenacin (VESICARE) 10 MG tablet Take 10 mg by mouth daily.     tadalafil (CIALIS) 5 MG tablet Take 5 mg by mouth daily as needed for erectile dysfunction.     tamsulosin (FLOMAX) 0.4 MG CAPS capsule Take 2 capsules (0.8 mg total) by mouth daily. 180 capsule 3   traMADol (ULTRAM) 50 MG tablet Take 2 tabs  every 6 hours as needed for moderate-severe pain. Not to exceed 4 tabs daily. 90 tablet 0   TRAVATAN Z 0.004 % SOLN ophthalmic solution Place 1 drop into both eyes at bedtime.     vitamin B-12 (CYANOCOBALAMIN) 1000 MCG tablet Take 1,000 mcg by mouth daily.      Allergies Allergies  Allergen Reactions   Brinzolamide-Brimonidine     Other reaction(s): Itching of eye, Red eye, Itching of eye, Red eye    Family History Family History  Problem Relation Age of Onset   Alzheimer's disease Mother    Hypertension Father    Liver cancer Father     Review of Systems: Constitutional:  no fevers Eye:  no recent significant change in vision Ear/Nose/Mouth/Throat:  Ears:  no hearing loss Nose/Mouth/Throat:  no complaints of nasal congestion, no sore throat Cardiovascular:  no chest pain Respiratory:  no shortness of breath Gastrointestinal:  no change in bowel habits GU:  Male: negative for dysuria, frequency Musculoskeletal/Extremities:  no joint pain Integumentary (Skin/Breast):  no abnormal skin lesions reported Neurologic:  no headaches Endocrine: No unexpected weight changes Hematologic/Lymphatic:  no abnormal bleeding  Exam BP 138/86 (BP Location: Left Arm, Patient Position: Sitting, Cuff Size: Normal)   Pulse (!) 55   Temp 98 F (36.7 C) (Oral)   Resp 16   Ht 6\' 3"  (1.905 m)   Wt 200 lb 9.6 oz (91 kg)   SpO2 95%   BMI 25.07 kg/m  General:  well developed, well nourished, in no apparent distress Skin:  no significant moles, warts, or growths Head:  no masses, lesions, or tenderness Eyes:  pupils equal and round, sclera anicteric without injection Ears:  canals without lesions, TMs shiny without retraction, no obvious effusion, no erythema Nose:  nares patent, mucosa normal Throat/Pharynx:  lips and gingiva without lesion; tongue and uvula midline; non-inflamed pharynx; no exudates or postnasal drainage Neck: neck supple without adenopathy, thyromegaly, or masses Cardiac:  Reg rhythm, bradycardic, no bruits, no LE edema Lungs:  clear to auscultation, breath sounds equal bilaterally, no respiratory distress Abdomen: BS+, soft, non-tender, non-distended, no masses or organomegaly noted Rectal: Deferred Musculoskeletal:  symmetrical muscle groups noted without atrophy or deformity Neuro:  gait normal; deep tendon reflexes normal and symmetric Psych: well oriented with normal range of affect and appropriate judgment/insight  Assessment and Plan  Well adult exam  Benign essential hypertension - Plan: lisinopril (ZESTRIL) 10 MG tablet, CBC, Comprehensive metabolic panel, Lipid panel  Screening for prostate cancer - Plan: PSA, Medicare ( [place] only)   Well 62 y.o. male. Counseled on diet and exercise. Counseled on risks and benefits of prostate cancer screening with PSA. The patient agrees to undergo testing. Advanced directive form requested today.  Immunizations, labs, and further orders as above. Follow up in 6 mo. The patient voiced understanding  and agreement to the plan.  Jilda Roche [place], DO 01/25/23 9:37 AM

## 2023-01-25 NOTE — Patient Instructions (Signed)
Give us 2-3 business days to get the results of your labs back.   Keep the diet clean and stay active.  Please get me a copy of your advanced directive form at your convenience.   Let us know if you need anything.  

## 2023-06-20 ENCOUNTER — Encounter: Payer: Self-pay | Admitting: Pharmacist

## 2023-06-20 ENCOUNTER — Other Ambulatory Visit: Payer: Self-pay | Admitting: Pharmacist

## 2023-06-20 ENCOUNTER — Ambulatory Visit: Payer: Self-pay | Admitting: *Deleted

## 2023-06-20 MED ORDER — ROSUVASTATIN CALCIUM 10 MG PO TABS: 10.0000 mg | ORAL_TABLET | Freq: Every day | ORAL | 1 refills | Status: DC

## 2023-06-20 NOTE — Progress Notes (Signed)
 Pharmacy Quality Measure Review  This patient is appearing on a report for low adherence for 2024 for cholesterol (statin) and hypertension (ACEi/ARB) medications.    Medication: rosuvastatin   Last fill date: 03/30/2023 for 90 days Patient will need updated Rx for next refill   Medication: lisinopril  Last fill date: (report had 02/03/02025 as last refill date but was actually filled 06/17/2023 for 90 days - previously filled for 90 days on 03/20/2023   Insurance report was not up to date. No action needed at this time  for lisinopril  but patient will need updated Rx for rosuvastatin . He will seen PCP tomorrow 06/21/2023.  Sent in update Rx for rosuvastatin .   Cecilie Coffee, PharmD Clinical Pharmacist [facility]  Population Health [phone]

## 2023-06-20 NOTE — Telephone Encounter (Signed)
  Chief Complaint: left testicle swelling , mild pain with sitting Symptoms: left scrotum 1 size bigger than right side. Mild pain with sitting can walk no other sx reported.  Frequency: 3-4 days ago  Pertinent Negatives: Patient denies severe pain no swelling in left leg. No injury reported no fever no N/V no abdominal pain Disposition: [] ED /[] Urgent Care (no appt availability in office) / [x] Appointment(In office/virtual)/ []  [place] [facility]/ [] Home Care/ [] Refused Recommended Disposition /[] [place] [facility]/ []  Follow-up with PCP Additional Notes:    Due to minimal pain only with sitting scheduled appt for tomorrow with PCP. Instructed patient is sx worsen call back / go to ED.      Copied from CRM [id number]. Topic: Clinical - Red Word Triage >> Jun 20, 2023 12:54 PM [place] E wrote: [facility] that prompted transfer to Nurse Triage: Testicle swelling. Patient stated he noticed his left testicle has been swelling up for the past 3 days. Patient stated it is somewhat painful, but he is still able to walk. Reason for Disposition  [1] Scrotum swelling AND [2] no pain    Mild pain with sitting  Answer Assessment - Initial Assessment Questions 1. SCROTAL SWELLING: "What does the scrotum look like?" "How swollen is it?" (mild, moderate severe; compare to other side)     1 size bigger left scrotum than right 2. LOCATION: "Where is the swelling located?"     Left testicle  3. ONSET: "When did the swelling start?"     3 days ago  4. PATTERN: "Does it come and go, or has it been constant since it started?"     Comes and goes  5. SCROTAL PAIN: "Is there any pain?" If Yes, ask: "How bad is it?"  (Scale 1-10; or mild, moderate, severe)     Mild pain with sitting  6. HERNIA: "Has a doctor ever told you that you have a hernia?"     na 7. OTHER SYMPTOMS: "Do you have any other symptoms?" (e.g., abdomen pain, difficulty passing urine, fever, vomiting)     No  Protocols used:  Scrotum Swelling-A-AH

## 2023-06-21 ENCOUNTER — Encounter: Payer: Self-pay | Admitting: Family Medicine

## 2023-06-21 ENCOUNTER — Ambulatory Visit (INDEPENDENT_AMBULATORY_CARE_PROVIDER_SITE_OTHER): Admitting: Family Medicine

## 2023-06-21 VITALS — BP 128/82 | HR 68 | Temp 98.0°F | Resp 16 | Wt 197.0 lb

## 2023-06-21 DIAGNOSIS — M549 Dorsalgia, unspecified: Secondary | ICD-10-CM | POA: Diagnosis not present

## 2023-06-21 DIAGNOSIS — N5089 Other specified disorders of the male genital organs: Secondary | ICD-10-CM

## 2023-06-21 MED ORDER — TRAMADOL HCL 50 MG PO TABS: ORAL_TABLET | ORAL | 0 refills | Status: AC

## 2023-06-21 NOTE — Patient Instructions (Addendum)
 Ice/cold pack over area for 10-15 min twice daily.  OK to take Tylenol 1000 mg (2 extra strength tabs) or 975 mg (3 regular strength tabs) every 6 hours as needed.  Someone should reach out in the next week or so regarding your ultrasound.   Heat (pad or rice pillow in microwave) over affected area, 10-15 minutes twice daily.   Ice/cold pack over area for 10-15 min twice daily.  OK to take Tylenol 1000 mg (2 extra strength tabs) or 975 mg (3 regular strength tabs) every 6 hours as needed.  Let us  know if you need anything.  EXERCISES  RANGE OF MOTION (ROM) AND STRETCHING EXERCISES - Low Back Pain Most people with lower back pain will find that their symptoms get worse with excessive bending forward (flexion) or arching at the lower back (extension). The exercises that will help resolve your symptoms will focus on the opposite motion.  If you have pain, numbness or tingling which travels down into your buttocks, leg or foot, the goal of the therapy is for these symptoms to move closer to your back and eventually resolve. Sometimes, these leg symptoms will get better, but your lower back pain may worsen. This is often an indication of progress in your rehabilitation. Be very alert to any changes in your symptoms and the activities in which you participated in the 24 hours prior to the change. Sharing this information with your caregiver will allow him or her to most efficiently treat your condition. These exercises may help you when beginning to rehabilitate your injury. Your symptoms may resolve with or without further involvement from your physician, physical therapist or athletic trainer. While completing these exercises, remember:  Restoring tissue flexibility helps normal motion to return to the joints. This allows healthier, less painful movement and activity. An effective stretch should be held for at least 30 seconds. A stretch should never be painful. You should only feel a gentle  lengthening or release in the stretched tissue. FLEXION RANGE OF MOTION AND STRETCHING EXERCISES:  STRETCH - Flexion, Single Knee to Chest  Lie on a firm bed or floor with both legs extended in front of you. Keeping one leg in contact with the floor, bring your opposite knee to your chest. Hold your leg in place by either grabbing behind your thigh or at your knee. Pull until you feel a gentle stretch in your low back. Hold 30 seconds. Slowly release your grasp and repeat the exercise with the opposite side. Repeat 2 times. Complete this exercise 3 times per week.   STRETCH - Flexion, Double Knee to Chest Lie on a firm bed or floor with both legs extended in front of you. Keeping one leg in contact with the floor, bring your opposite knee to your chest. Tense your stomach muscles to support your back and then lift your other knee to your chest. Hold your legs in place by either grabbing behind your thighs or at your knees. Pull both knees toward your chest until you feel a gentle stretch in your low back. Hold 30 seconds. Tense your stomach muscles and slowly return one leg at a time to the floor. Repeat 2 times. Complete this exercise 3 times per week.   STRETCH - Low Trunk Rotation Lie on a firm bed or floor. Keeping your legs in front of you, bend your knees so they are both pointed toward the ceiling and your feet are flat on the floor. Extend your arms out to the side.  This will stabilize your upper body by keeping your shoulders in contact with the floor. Gently and slowly drop both knees together to one side until you feel a gentle stretch in your low back. Hold for 30 seconds. Tense your stomach muscles to support your lower back as you bring your knees back to the starting position. Repeat the exercise to the other side. Repeat 2 times. Complete this exercise at least 3 times per week.   EXTENSION RANGE OF MOTION AND FLEXIBILITY EXERCISES:  STRETCH - Extension, Prone on Elbows   Lie on your stomach on the floor, a bed will be too soft. Place your palms about shoulder width apart and at the height of your head. Place your elbows under your shoulders. If this is too painful, stack pillows under your chest. Allow your body to relax so that your hips drop lower and make contact more completely with the floor. Hold this position for 30 seconds. Slowly return to lying flat on the floor. Repeat 2 times. Complete this exercise 3 times per week.   RANGE OF MOTION - Extension, Prone Press Ups Lie on your stomach on the floor, a bed will be too soft. Place your palms about shoulder width apart and at the height of your head. Keeping your back as relaxed as possible, slowly straighten your elbows while keeping your hips on the floor. You may adjust the placement of your hands to maximize your comfort. As you gain motion, your hands will come more underneath your shoulders. Hold this position 30 seconds. Slowly return to lying flat on the floor. Repeat 2 times. Complete this exercise 3 times per week.   RANGE OF MOTION- Quadruped, Neutral Spine  Assume a hands and knees position on a firm surface. Keep your hands under your shoulders and your knees under your hips. You may place padding under your knees for comfort. Drop your head and point your tailbone toward the ground below you. This will round out your lower back like an angry cat. Hold this position for 30 seconds. Slowly lift your head and release your tail bone so that your back sags into a large arch, like an old horse. Hold this position for 30 seconds. Repeat this until you feel limber in your low back. Now, find your "sweet spot." This will be the most comfortable position somewhere between the two previous positions. This is your neutral spine. Once you have found this position, tense your stomach muscles to support your low back. Hold this position for 30 seconds. Repeat 2 times. Complete this exercise 3 times per  week.   STRENGTHENING EXERCISES - Low Back Sprain These exercises may help you when beginning to rehabilitate your injury. These exercises should be done near your "sweet spot." This is the neutral, low-back arch, somewhere between fully rounded and fully arched, that is your least painful position. When performed in this safe range of motion, these exercises can be used for people who have either a flexion or extension based injury. These exercises may resolve your symptoms with or without further involvement from your physician, physical therapist or athletic trainer. While completing these exercises, remember:  Muscles can gain both the endurance and the strength needed for everyday activities through controlled exercises. Complete these exercises as instructed by your physician, physical therapist or athletic trainer. Increase the resistance and repetitions only as guided. You may experience muscle soreness or fatigue, but the pain or discomfort you are trying to eliminate should never worsen during these  exercises. If this pain does worsen, stop and make certain you are following the directions exactly. If the pain is still present after adjustments, discontinue the exercise until you can discuss the trouble with your caregiver.  STRENGTHENING - Deep Abdominals, Pelvic Tilt  Lie on a firm bed or floor. Keeping your legs in front of you, bend your knees so they are both pointed toward the ceiling and your feet are flat on the floor. Tense your lower abdominal muscles to press your low back into the floor. This motion will rotate your pelvis so that your tail bone is scooping upwards rather than pointing at your feet or into the floor. With a gentle tension and even breathing, hold this position for 3 seconds. Repeat 2 times. Complete this exercise 3 times per week.   STRENGTHENING - Abdominals, Crunches  Lie on a firm bed or floor. Keeping your legs in front of you, bend your knees so they are  both pointed toward the ceiling and your feet are flat on the floor. Cross your arms over your chest. Slightly tip your chin down without bending your neck. Tense your abdominals and slowly lift your trunk high enough to just clear your shoulder blades. Lifting higher can put excessive stress on the lower back and does not further strengthen your abdominal muscles. Control your return to the starting position. Repeat 2 times. Complete this exercise 3 times per week.   STRENGTHENING - Quadruped, Opposite UE/LE Lift  Assume a hands and knees position on a firm surface. Keep your hands under your shoulders and your knees under your hips. You may place padding under your knees for comfort. Find your neutral spine and gently tense your abdominal muscles so that you can maintain this position. Your shoulders and hips should form a rectangle that is parallel with the floor and is not twisted. Keeping your trunk steady, lift your right hand no higher than your shoulder and then your left leg no higher than your hip. Make sure you are not holding your breath. Hold this position for 30 seconds. Continuing to keep your abdominal muscles tense and your back steady, slowly return to your starting position. Repeat with the opposite arm and leg. Repeat 2 times. Complete this exercise 3 times per week.   STRENGTHENING - Abdominals and Quadriceps, Straight Leg Raise  Lie on a firm bed or floor with both legs extended in front of you. Keeping one leg in contact with the floor, bend the other knee so that your foot can rest flat on the floor. Find your neutral spine, and tense your abdominal muscles to maintain your spinal position throughout the exercise. Slowly lift your straight leg off the floor about 6 inches for a count of 3, making sure to not hold your breath. Still keeping your neutral spine, slowly lower your leg all the way to the floor. Repeat this exercise with each leg 2 times. Complete this exercise 3  times per week.  POSTURE AND BODY MECHANICS CONSIDERATIONS - Low Back Sprain Keeping correct posture when sitting, standing or completing your activities will reduce the stress put on different body tissues, allowing injured tissues a chance to heal and limiting painful experiences. The following are general guidelines for improved posture.  While reading these guidelines, remember: The exercises prescribed by your provider will help you have the flexibility and strength to maintain correct postures. The correct posture provides the best environment for your joints to work. All of your joints have less wear  and tear when properly supported by a spine with good posture. This means you will experience a healthier, less painful body. Correct posture must be practiced with all of your activities, especially prolonged sitting and standing. Correct posture is as important when doing repetitive low-stress activities (typing) as it is when doing a single heavy-load activity (lifting).  RESTING POSITIONS Consider which positions are most painful for you when choosing a resting position. If you have pain with flexion-based activities (sitting, bending, stooping, squatting), choose a position that allows you to rest in a less flexed posture. You would want to avoid curling into a fetal position on your side. If your pain worsens with extension-based activities (prolonged standing, working overhead), avoid resting in an extended position such as sleeping on your stomach. Most people will find more comfort when they rest with their spine in a more neutral position, neither too rounded nor too arched. Lying on a non-sagging bed on your side with a pillow between your knees, or on your back with a pillow under your knees will often provide some relief. Keep in mind, being in any one position for a prolonged period of time, no matter how correct your posture, can still lead to stiffness.  PROPER SITTING POSTURE In order  to minimize stress and discomfort on your spine, you must sit with correct posture. Sitting with good posture should be effortless for a healthy body. Returning to good posture is a gradual process. Many people can work toward this most comfortably by using various supports until they have the flexibility and strength to maintain this posture on their own. When sitting with proper posture, your ears will fall over your shoulders and your shoulders will fall over your hips. You should use the back of the chair to support your upper back. Your lower back will be in a neutral position, just slightly arched. You may place a small pillow or folded towel at the base of your lower back for  support.  When working at a desk, create an environment that supports good, upright posture. Without extra support, muscles tire, which leads to excessive strain on joints and other tissues. Keep these recommendations in mind:  CHAIR: A chair should be able to slide under your desk when your back makes contact with the back of the chair. This allows you to work closely. The chair's height should allow your eyes to be level with the upper part of your monitor and your hands to be slightly lower than your elbows.  BODY POSITION Your feet should make contact with the floor. If this is not possible, use a foot rest. Keep your ears over your shoulders. This will reduce stress on your neck and low back.  INCORRECT SITTING POSTURES  If you are feeling tired and unable to assume a healthy sitting posture, do not slouch or slump. This puts excessive strain on your back tissues, causing more damage and pain. Healthier options include: Using more support, like a lumbar pillow. Switching tasks to something that requires you to be upright or walking. Talking a brief walk. Lying down to rest in a neutral-spine position.  PROLONGED STANDING WHILE SLIGHTLY LEANING FORWARD  When completing a task that requires you to lean forward  while standing in one place for a long time, place either foot up on a stationary 2-4 inch high object to help maintain the best posture. When both feet are on the ground, the lower back tends to lose its slight inward curve. If this  curve flattens (or becomes too large), then the back and your other joints will experience too much stress, tire more quickly, and can cause pain.  CORRECT STANDING POSTURES Proper standing posture should be assumed with all daily activities, even if they only take a few moments, like when brushing your teeth. As in sitting, your ears should fall over your shoulders and your shoulders should fall over your hips. You should keep a slight tension in your abdominal muscles to brace your spine. Your tailbone should point down to the ground, not behind your body, resulting in an over-extended swayback posture.   INCORRECT STANDING POSTURES  Common incorrect standing postures include a forward head, locked knees and/or an excessive swayback. WALKING Walk with an upright posture. Your ears, shoulders and hips should all line-up.  PROLONGED ACTIVITY IN A FLEXED POSITION When completing a task that requires you to bend forward at your waist or lean over a low surface, try to find a way to stabilize 3 out of 4 of your limbs. You can place a hand or elbow on your thigh or rest a knee on the surface you are reaching across. This will provide you more stability, so that your muscles do not tire as quickly. By keeping your knees relaxed, or slightly bent, you will also reduce stress across your lower back. CORRECT LIFTING TECHNIQUES  DO : Assume a wide stance. This will provide you more stability and the opportunity to get as close as possible to the object which you are lifting. Tense your abdominals to brace your spine. Bend at the knees and hips. Keeping your back locked in a neutral-spine position, lift using your leg muscles. Lift with your legs, keeping your back straight. Test  the weight of unknown objects before attempting to lift them. Try to keep your elbows locked down at your sides in order get the best strength from your shoulders when carrying an object.   Always ask for help when lifting heavy or awkward objects. INCORRECT LIFTING TECHNIQUES DO NOT:  Lock your knees when lifting, even if it is a small object. Bend and twist. Pivot at your feet or move your feet when needing to change directions. Assume that you can safely pick up even a paperclip without proper posture.

## 2023-06-21 NOTE — Progress Notes (Signed)
 Chief Complaint  Patient presents with   Groin Swelling    Left Growin swelling     Justin Frazier is a 63 y.o. male here for a skin complaint.  Duration: 5 days Location: L scrotum Pruritic? No Painful? No Drainage? No New soaps/lotions/topicals/detergents? No Trauma? No Other associated symptoms: swelling No fevers, redness, urinary complaints, new sexual partners Therapies tried thus far: cold pack helped  Past Medical History:  Diagnosis Date   Abnormal EKG 03/20/2017   Adhesive capsulitis of left shoulder 07/26/2019   Allergic rhinitis    Apical variant hypertrophic cardiomyopathy (HCC) 05/27/2019   Asthma, currently active 12/27/2022   Benign essential hypertension 05/22/2020   Benign prostatic hyperplasia with weak urinary stream 09/03/2021   Carpal tunnel syndrome    Central serous retinopathy    CKD (chronic kidney disease), stage I    DOE (dyspnea on exertion) 03/30/2017   Dry eye syndrome 12/22/2020   Enlarged lymph node 01/16/2019   Glaucoma    HTN (hypertension) 03/20/2017   Hyperlipidemia 01/20/2020   Left groin pain 01/20/2020   Low back pain 05/22/2020   Lumbar strain    LVH (left ventricular hypertrophy) 03/20/2017   Mild intermittent asthma without complication 07/10/2018   Mild persistent asthma without complication 06/19/2020   Myopia 12/22/2020   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 Concern over right parotid mass. See HPI. Examination reveals a mobile 1 cm mass in the immediate right preauricular area.  No surrounding adenopathy.  Head and neck is otherwise negative for nodes. PLAN: We discussed most tumors of the parotid gland are benign.  Needle biopsy is helpful to determine the nature.   NSVT (nonsustained ventricular tachycardia) (HCC)    Seen by Dr. Donna Fus (EP) recommended beta blocker therapy (2014)   Onychomycosis 05/22/2020   Primary open angle  glaucoma of both eyes 12/22/2020   Primary open-angle glaucoma, bilateral, severe stage 12/22/2020   Primary open-angle glaucoma, left eye, severe stage 12/22/2020   Proteinuria    Renal cyst, left 03/08/2018   Sciatica of left side    Thrombocytopenia (HCC) 02/01/2018   Tinea unguium 05/22/2020   Unintentional weight loss of more than 10 pounds 08/31/2018   Visual loss, both eyes unqualified 12/27/2022   Well adult exam 03/30/2017    BP 128/82 (BP Location: Left Arm, Patient Position: Sitting)   Pulse 68   Temp 98 F (36.7 C) (Oral)   Resp 16   Wt 197 lb (89.4 kg)   SpO2 97%   BMI 24.62 kg/m  Gen: awake, alert, appearing stated age Lungs: No accessory muscle use Skin: No edema or external lesions noted over the scrotum.  I do not appreciate any fluctuance, drainage, excoriation, excessive warmth, erythema.  There is some density noted over the left testicle compared to the right.  It is TTP.  No excessive TTP over the epididymis. Psych: Age appropriate judgment and insight  Scrotal swelling - Plan: US  Scrotum  Back pain, unspecified back location, unspecified back pain laterality, unspecified chronicity - Plan: traMADol  (ULTRAM ) 50 MG tablet  Check an ultrasound to be on the safe side.  Ice, Tylenol, ibuprofen. Stretches and exercises provided.  Refill tramadol .  PT if not significantly improved. F/u prn. The patient voiced understanding and agreement to the plan.  Shellie Dials [place], DO 06/21/23 1:49 PM

## 2023-06-26 ENCOUNTER — Ambulatory Visit (INDEPENDENT_AMBULATORY_CARE_PROVIDER_SITE_OTHER)
Admission: RE | Admit: 2023-06-26 | Discharge: 2023-06-26 | Disposition: A | Discharge status: Home / Self Care | Source: Ambulatory Visit | Attending: Family Medicine | Admitting: Family Medicine

## 2023-06-26 DIAGNOSIS — N5089 Other specified disorders of the male genital organs: Secondary | ICD-10-CM

## 2023-06-26 DIAGNOSIS — R609 Edema, unspecified: Secondary | ICD-10-CM | POA: Diagnosis not present

## 2023-07-03 ENCOUNTER — Ambulatory Visit: Payer: Self-pay | Admitting: Family Medicine

## 2023-07-05 ENCOUNTER — Telehealth: Payer: Self-pay

## 2023-07-05 MED ORDER — DOXYCYCLINE HYCLATE 100 MG PO TABS: 100.0000 mg | ORAL_TABLET | Freq: Two times a day (BID) | ORAL | 0 refills | Status: DC

## 2023-07-05 NOTE — Telephone Encounter (Signed)
 I sent him a message. Is he having pain?

## 2023-07-05 NOTE — Telephone Encounter (Signed)
 Sent pt message letting him know med sent.

## 2023-07-05 NOTE — Telephone Encounter (Signed)
 Copied from CRM [id number]. Topic: Clinical - Lab/Test Results >> Jul 05, 2023  8:55 AM Justin Frazier wrote: Reason for CRM: Reason for CRM: Patient states he had an ultra sound done a few days ago. Following up for results and to see if he needs and antibiotic or any other medication. Patient would like a call back.  Patient can be reached at [phone]

## 2023-07-05 NOTE — Addendum Note (Signed)
 Addended by: Creed Dodrill on: 07/05/2023 02:27 PM   Modules accepted: Orders

## 2023-07-13 ENCOUNTER — Ambulatory Visit: Payer: Self-pay

## 2023-07-13 NOTE — Telephone Encounter (Signed)
  Chief Complaint: scrotum pain  Symptoms: pain   Disposition: [] ED /[] Urgent Care (no appt availability in office) / [x] Appointment(In office/virtual)/ []  [facility]/ [] Home Care/ [] Refused Recommended Disposition /[] [facility]/ []  Follow-up with PCP Additional Notes: Pt calling with worsening left scrotum pain. Pt stated  he has improved with antibiotic but last 2 days the pain has been terrible. "My pain is 8-10. It hurts to stand and walk and I have to be careful how I sit down." Pt stated there  seems to be some swelling. No discoloration. Pt is taking Tramadol  for pain. Pt is using ice packs and gets some relief. Pt has appt 5/30 @ 0820. RN gave care advice and pt verbalized understanding.               Copied from CRM [id number]. Topic: Clinical - Red Word Triage >> Jul 13, 2023  3:56 PM Dewanda Foots wrote: Red Word that prompted transfer to Nurse Triage: Patient states he is having pain that is getting progressively worse in the left "scrotum", especially when he walks. This has been ongoing for almost a month now and the pain is getting worse. Reason for Disposition  [1] Pain comes and goes (intermittent) AND [2] present > 24 hours  Answer Assessment - Initial Assessment Questions 1. LOCATION and RADIATION: "Where is the pain located?"      Left scrotum  2. QUALITY: "What does the pain feel like?"  (e.g., sharp, dull, aching, burning)     Achy/hurts to touch 3. SEVERITY: "How bad is the pain?"  (Scale 1-10; or mild, moderate, severe)   - MILD (1-3): doesn't interfere with normal activities    - MODERATE (4-7): interferes with normal activities (e.g., work or school) or awakens from sleep   - SEVERE (8-10): excruciating pain, unable to do any normal activities, difficulty walking     8 while walking  4. ONSET: "When did the pain start?"     About a month  5. PATTERN: "Does it come and go, or has it been constant since it started?"     Comes and goes   6. SCROTAL APPEARANCE: "What does the scrotum look like?" "Is there any swelling or redness?"      Some swelling compared to right testicle   8. OTHER SYMPTOMS: "Do you have any other symptoms?" (e.g., abdomen pain, difficulty passing urine, fever, vomiting)     Denies  Protocols used: Scrotum Pain-A-AH

## 2023-07-14 ENCOUNTER — Ambulatory Visit (HOSPITAL_BASED_OUTPATIENT_CLINIC_OR_DEPARTMENT_OTHER)
Admission: RE | Admit: 2023-07-14 | Discharge: 2023-07-14 | Disposition: A | Discharge status: Home / Self Care | Source: Ambulatory Visit | Attending: Family Medicine | Admitting: Family Medicine

## 2023-07-14 ENCOUNTER — Encounter: Payer: Self-pay | Admitting: Student

## 2023-07-14 ENCOUNTER — Ambulatory Visit: Admitting: Student

## 2023-07-14 VITALS — BP 110/60 | HR 54 | Temp 98.0°F | Ht 75.0 in | Wt 200.0 lb

## 2023-07-14 DIAGNOSIS — R1032 Left lower quadrant pain: Secondary | ICD-10-CM

## 2023-07-14 DIAGNOSIS — N5082 Scrotal pain: Secondary | ICD-10-CM | POA: Diagnosis not present

## 2023-07-14 HISTORY — DX: Scrotal pain: N50.82

## 2023-07-14 LAB — POCT URINALYSIS DIPSTICK
Blood, UA: NEGATIVE
Glucose, UA: NEGATIVE
Ketones, UA: NEGATIVE
Leukocytes, UA: NEGATIVE
Nitrite, UA: NEGATIVE
Odor: NORMAL
Protein, UA: NEGATIVE
Spec Grav, UA: 1.02 (ref 1.010–1.025)
Urobilinogen, UA: 0.2 U/dL
pH, UA: 5 (ref 5.0–8.0)

## 2023-07-14 LAB — URINALYSIS, ROUTINE W REFLEX MICROSCOPIC
Bilirubin Urine: NEGATIVE
Hgb urine dipstick: NEGATIVE
Ketones, ur: NEGATIVE
Leukocytes,Ua: NEGATIVE
Nitrite: NEGATIVE
Specific Gravity, Urine: 1.025 (ref 1.000–1.030)
Total Protein, Urine: NEGATIVE
Urine Glucose: NEGATIVE
Urobilinogen, UA: 0.2 (ref 0.0–1.0)
pH: 6 (ref 5.0–8.0)

## 2023-07-14 MED ORDER — MELOXICAM 15 MG PO TABS: 15.0000 mg | ORAL_TABLET | Freq: Every day | ORAL | 0 refills | Status: DC

## 2023-07-14 NOTE — Progress Notes (Signed)
 Subjective:     Patient ID: Justin Frazier, male    DOB: Sep 03, 1960, 63 y.o.   MRN: [id number]  Chief Complaint  Patient presents with   Groin Pain    L testicle; last couple of days been really painful; icing and pain meds help some but also swelling and tender to touch; had biopsy on prostate a couple of months ago    HPI Justin Frazier is a 63 y.o. male presents today w/ complaint of leg/scrotal pain.  He had biopsy of prostate in March 2025.  Presented to clinic for OV 06/21/2023  for scrotal pain. Ultrasound performed on 06/26/18/2025 showing minimal hydrocele on left, possible left scrotal wall cellulitis. Pt was prescribed doxycycline 100 mg BID, he reports he has been compliant with the medication and last day of course of antibiotics is tomorrow.  Patient stated to improve felt about a week after starting the antibiotics, but a few days ago pain returned. Reports left groin and scrotal pain.  Onset: few days ago Duration of pain- worse with walking or standing  Previous pain episodes Nausea and vomiting- no Fever- no History of trauma- no  Dysuria or discharge- no Recent urologic procedure: Yes Feeling like scroutm is rising up Denies new sexual partners Location of pain - left groin, posterior scrotum No fevers, redness, urinary complaints, new sexual partners Therapies tried thus far: cold pack helped.  Patient denies fever, chills, SOB, CP, palpitations, dyspnea, edema, HA, vision changes, N/V/D, abdominal pain, urinary symptoms, rash, recent illness or hospitalizations.    History of Present Illness              Health Maintenance Due  Topic Date Due   Medicare Annual Wellness (AWV)  10/09/2021    Past Medical History:  Diagnosis Date   Abnormal EKG 03/20/2017   Adhesive capsulitis of left shoulder 07/26/2019   Allergic rhinitis    Apical variant hypertrophic cardiomyopathy (HCC) 05/27/2019   Asthma, currently active 12/27/2022   Benign essential  hypertension 05/22/2020   Benign prostatic hyperplasia with weak urinary stream 09/03/2021   Carpal tunnel syndrome    Central serous retinopathy    CKD (chronic kidney disease), stage I    DOE (dyspnea on exertion) 03/30/2017   Dry eye syndrome 12/22/2020   Enlarged lymph node 01/16/2019   Glaucoma    HTN (hypertension) 03/20/2017   Hyperlipidemia 01/20/2020   Left groin pain 01/20/2020   Low back pain 05/22/2020   Lumbar strain    LVH (left ventricular hypertrophy) 03/20/2017   Mild intermittent asthma without complication 07/10/2018   Mild persistent asthma without complication 06/19/2020   Myopia 12/22/2020   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 Concern over right parotid mass. See HPI. Examination reveals a mobile 1 cm mass in the immediate right preauricular area.  No surrounding adenopathy.  Head and neck is otherwise negative for nodes. PLAN: We discussed most tumors of the parotid gland are benign.  Needle biopsy is helpful to determine the nature.   NSVT (nonsustained ventricular tachycardia) (HCC)    Seen by Dr. Donna Fus (EP) recommended beta blocker therapy (2014)   Onychomycosis 05/22/2020   Primary open angle glaucoma of both eyes 12/22/2020   Primary open-angle glaucoma, bilateral, severe stage 12/22/2020   Primary open-angle glaucoma, left eye, severe stage 12/22/2020   Proteinuria    Renal cyst, left 03/08/2018   Sciatica of left side  Thrombocytopenia (HCC) 02/01/2018   Tinea unguium 05/22/2020   Unintentional weight loss of more than 10 pounds 08/31/2018   Visual loss, both eyes unqualified 12/27/2022   Well adult exam 03/30/2017    Past Surgical History:  Procedure Laterality Date   COLONOSCOPY  06/11/2013    Family History  Problem Relation Age of Onset   Alzheimer's disease Mother    Hypertension Father    Liver cancer Father     Social History   Socioeconomic  History   Marital status: Married    Spouse name: Not on file   Number of children: 2   Years of education: 12   Highest education level: Not on file  Occupational History   Not on file  Tobacco Use   Smoking status: Never   Smokeless tobacco: Never  Vaping Use   Vaping status: Never Used  Substance and Sexual Activity   Alcohol use: No   Drug use: Yes   Sexual activity: Not Currently  Other Topics Concern   Not on file  Social History Narrative   Right handed   Drinks caffeine   Lives with wife   2 story home   Retired marine 2011   Social Drivers of Health   Financial Resource Strain: Low Risk  (10/09/2020)   Overall Financial Resource Strain (CARDIA)    Difficulty of Paying Living Expenses: Not hard at all  Food Insecurity: No Food Insecurity (10/09/2020)   Hunger Vital Sign    Worried About Running Out of Food in the Last Year: Never true    Ran Out of Food in the Last Year: Never true  Transportation Needs: No Transportation Needs (10/09/2020)   PRAPARE - Administrator, Civil Service (Medical): No    Lack of Transportation (Non-Medical): No  Physical Activity: Insufficiently Active (10/09/2020)   Exercise Vital Sign    Days of Exercise per Week: 2 days    Minutes of Exercise per Session: 30 min  Stress: No Stress Concern Present (10/09/2020)   [employer] of Occupational Health - Occupational Stress Questionnaire    Feeling of Stress : Not at all  Social Connections: Moderately Integrated (10/09/2020)   Social Connection and Isolation Panel [NHANES]    Frequency of Communication with Friends and Family: More than three times a week    Frequency of Social Gatherings with Friends and Family: More than three times a week    Attends Religious Services: More than 4 times per year    Active Member of [employer] or Organizations: No    Attends Banker Meetings: Never    Marital Status: Married  Catering manager Violence: Not At Risk (10/09/2020)    Humiliation, Afraid, Rape, and Kick questionnaire    Fear of Current or Ex-Partner: No    Emotionally Abused: No    Physically Abused: No    Sexually Abused: No    Outpatient Medications Prior to Visit  Medication Sig Dispense Refill   albuterol  (VENTOLIN  HFA) 108 (90 Base) MCG/ACT inhaler INHALE 1 PUFF INTO THE LUNGS EVERY 6 HOURS AS NEEDED FOR WHEEZING OR SHORTNESS OF BREATH. 8.5 each 2   aspirin EC 81 MG tablet Take 81 mg by mouth daily.      Cholecalciferol 5000 units TABS Take 5,000 mg by mouth daily.     dorzolamide-timolol (COSOPT) 22.3-6.8 MG/ML ophthalmic solution Place 1 drop into both eyes 2 (two) times daily.     doxycycline (VIBRA-TABS) 100 MG tablet Take 1 tablet (100 mg  total) by mouth 2 (two) times daily. 20 tablet 0   EPINEPHrine  0.3 mg/0.3 mL IJ SOAJ injection Inject 0.3 mg into the muscle as needed for anaphylaxis. As needed for severe allergic reaction then call 911 2 each 1   lisinopril  (ZESTRIL ) 10 MG tablet Take 1 tablet (10 mg total) by mouth daily. 90 tablet 3   metoprolol  succinate (TOPROL -XL) 25 MG 24 hr tablet Take 25 mg by mouth daily.     montelukast  (SINGULAIR ) 10 MG tablet Take 1 tablet (10 mg total) by mouth daily. 90 tablet 2   RHOPRESSA 0.02 % SOLN Place 1 drop into both eyes daily.     rosuvastatin  (CRESTOR ) 10 MG tablet Take 1 tablet (10 mg total) by mouth daily. Patient needs an appointment for further refills. 3 rd/final attempt 90 tablet 1   solifenacin (VESICARE) 10 MG tablet Take 10 mg by mouth daily.     tadalafil (CIALIS) 5 MG tablet Take 5 mg by mouth daily as needed for erectile dysfunction.     tamsulosin  (FLOMAX ) 0.4 MG CAPS capsule Take 2 capsules (0.8 mg total) by mouth daily. 180 capsule 3   traMADol  (ULTRAM ) 50 MG tablet Take 1-2 tabs every 6 hours as needed for moderate-severe pain. Not to exceed 4 tabs daily. 90 tablet 0   TRAVATAN Z 0.004 % SOLN ophthalmic solution Place 1 drop into both eyes at bedtime.     vitamin B-12  (CYANOCOBALAMIN ) 1000 MCG tablet Take 1,000 mcg by mouth daily.     No facility-administered medications prior to visit.    Allergies  Allergen Reactions   Brinzolamide-Brimonidine     Other reaction(s): Itching of eye, Red eye, Itching of eye, Red eye    ROS    See HPI Objective:     Physical Exam  Scrotum with normal hair distribution, no lesions. Testes descended bilaterally, without masses or deformities. No erythema or discoloration. Epididymis and spermatic cords palpate normally. Mild TWP.. +L testis swelling  BP 110/60   Pulse (!) 54   Temp 98 F (36.7 C) (Oral)   Ht 6\' 3"  (1.905 m)   Wt 200 lb (90.7 kg)   SpO2 98%   BMI 25.00 kg/m  Wt Readings from Last 3 Encounters:  07/14/23 200 lb (90.7 kg)  06/21/23 197 lb (89.4 kg)  01/25/23 200 lb 9.6 oz (91 kg)       Assessment & Plan:   Problem List Items Addressed This Visit     Left groin pain - Primary   Ordered a soft tissue ultrasound of the left groin. Will review results and consider referral to general surgery if a hernia is identified that may be contributing to ongoing pain. Prescribed meloxicam 7.5 mg as needed for pain.      Relevant Medications   meloxicam (MOBIC) 15 MG tablet   Other Relevant Orders   US  LT LOWER EXTREM LTD SOFT TISSUE NON VASCULAR   Scrotum pain   UA negative; culture sent to confirm. Tested for gonorrhea, chlamydia, and trichomoniasis-awaiting results. Patient reports pain following the urologic biopsy performed in March 2025. Referred back to urology for further evaluation.  Patient educated on red flag symptoms was advised to seek emergency care if develops fever, or symptoms worsen.      Relevant Medications   meloxicam (MOBIC) 15 MG tablet   Other Relevant Orders   Urinalysis, Routine w reflex microscopic   Urine Culture   Urine cytology ancillary only   Ambulatory referral to Urology  POCT Urinalysis Dipstick (Completed)       I am having Justin Frazier  start on meloxicam. I am also having him maintain his aspirin EC, Cholecalciferol, dorzolamide-timolol, cyanocobalamin , Rhopressa, metoprolol  succinate, Travatan Z, albuterol , montelukast , EPINEPHrine , tamsulosin , solifenacin, tadalafil, lisinopril , rosuvastatin , traMADol , and doxycycline.  Meds ordered this encounter  Medications   meloxicam (MOBIC) 15 MG tablet    Sig: Take 1 tablet (15 mg total) by mouth daily.    Dispense:  14 tablet    Refill:  0    Supervising Provider:   Randie Bustle A [[id number]]

## 2023-07-14 NOTE — Assessment & Plan Note (Addendum)
 UA negative; culture sent to confirm. Tested for gonorrhea, chlamydia, and trichomoniasis-awaiting results. Patient reports pain following the urologic biopsy performed in March 2025. Referred back to urology for further evaluation.  Patient educated on red flag symptoms was advised to seek emergency care if develops fever, or symptoms worsen.

## 2023-07-14 NOTE — Assessment & Plan Note (Signed)
 Ordered a soft tissue ultrasound of the left groin. Will review results and consider referral to general surgery if a hernia is identified that may be contributing to ongoing pain. Prescribed meloxicam 7.5 mg as needed for pain.

## 2023-07-15 LAB — URINE CULTURE
MICRO NUMBER:: 16519799
Result:: NO GROWTH
SPECIMEN QUALITY:: ADEQUATE

## 2023-07-18 ENCOUNTER — Ambulatory Visit: Payer: Self-pay | Admitting: Student

## 2023-09-20 DIAGNOSIS — N138 Other obstructive and reflux uropathy: Secondary | ICD-10-CM | POA: Diagnosis not present

## 2023-09-28 ENCOUNTER — Encounter: Payer: Self-pay | Admitting: Cardiology

## 2023-09-28 ENCOUNTER — Ambulatory Visit: Attending: Cardiology | Admitting: Cardiology

## 2023-09-28 ENCOUNTER — Ambulatory Visit (INDEPENDENT_AMBULATORY_CARE_PROVIDER_SITE_OTHER)

## 2023-09-28 VITALS — BP 132/70 | HR 60 | Ht 75.0 in | Wt 202.8 lb

## 2023-09-28 DIAGNOSIS — E782 Mixed hyperlipidemia: Secondary | ICD-10-CM

## 2023-09-28 DIAGNOSIS — I422 Other hypertrophic cardiomyopathy: Secondary | ICD-10-CM | POA: Diagnosis not present

## 2023-09-28 DIAGNOSIS — I4729 Other ventricular tachycardia: Secondary | ICD-10-CM | POA: Diagnosis not present

## 2023-09-28 DIAGNOSIS — I10 Essential (primary) hypertension: Secondary | ICD-10-CM | POA: Diagnosis not present

## 2023-09-28 NOTE — Progress Notes (Signed)
 Cardiology Office Note:    Date:  09/28/2023   ID:  Justin Frazier Job, DOB 1961-01-25, MRN [id number]  PCP:  Justin Mabel Mt, DO  Cardiologist:  Justin JONELLE Crape, MD   Referring MD: Justin Frazier*    ASSESSMENT:    1. Apical variant hypertrophic cardiomyopathy (HCC)   2. Benign essential hypertension   3. NSVT (nonsustained ventricular tachycardia) (HCC)   4. Mixed hyperlipidemia    PLAN:    In order of problems listed above:  Primary prevention stressed with the patient.  Importance of compliance with diet medication stressed and patient verbalized standing. Palpitations and history of apical variant of hypertrophic cardiomyopathy: History of nonsustained ventricular tachycardia: Patient has no associated symptoms of dizziness or syncope.  Will do a 2-week monitor and I also discussed with him about Kardia self-monitoring.  I will also give him a follow-up with her electrophysiologist who he has seen in the past.  He is agreeable. Mixed dyslipidemia: Lipid-lowering medications followed by primary care.  Diet emphasized. Essential hypertension: Blood pressure stable and diet was emphasized. Patient will be seen in follow-up appointment in 6 months or earlier if the patient has any concerns.    Medication Adjustments/Labs and Tests Ordered: Current medicines are reviewed at length with the patient today.  Concerns regarding medicines are outlined above.  No orders of the defined types were placed in this encounter.  No orders of the defined types were placed in this encounter.    No chief complaint on file.    History of Present Illness:    Justin Frazier is a 63 y.o. male.  Patient has past medical history of apical variant of hypertrophic cardiomyopathy,Mixed dyslipidemia and essential hypertension.  He has history of nonsustained ventricular tachycardia.  He denies any problems at this time and takes care of activities of daily living.  He mentions to  me that he has palpitations on and off.  At the time of my evaluation, the patient is alert awake oriented and in no distress.  No dizziness or syncope.   Past Medical History:  Diagnosis Date   Abnormal EKG 03/20/2017   Adhesive capsulitis of left shoulder 07/26/2019   Allergic rhinitis    Apical variant hypertrophic cardiomyopathy (HCC) 05/27/2019   Asthma, currently active 12/27/2022   Benign essential hypertension 05/22/2020   Benign prostatic hyperplasia with weak urinary stream 09/03/2021   Carpal tunnel syndrome    Central serous retinopathy    CKD (chronic kidney disease), stage I    DOE (dyspnea on exertion) 03/30/2017   Dry eye syndrome 12/22/2020   Enlarged lymph node 01/16/2019   Glaucoma    HTN (hypertension) 03/20/2017   Hyperlipidemia 01/20/2020   Left groin pain 01/20/2020   Low back pain 05/22/2020   Lumbar strain    LVH (left ventricular hypertrophy) 03/20/2017   Mild intermittent asthma without complication 07/10/2018   Mild persistent asthma without complication 06/19/2020   Myopia 12/22/2020   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 Concern over right parotid mass. See HPI. Examination reveals a mobile 1 cm mass in the immediate right preauricular area.  No surrounding adenopathy.  Head and neck is otherwise negative for nodes. PLAN: We discussed most tumors of the parotid gland are benign.  Needle biopsy is helpful to determine the nature.   NSVT (nonsustained ventricular tachycardia) (HCC)    Seen by Dr. Cesario (EP) recommended beta  blocker therapy (2014)   Onychomycosis 05/22/2020   Primary open angle glaucoma of both eyes 12/22/2020   Primary open-angle glaucoma, bilateral, severe stage 12/22/2020   Primary open-angle glaucoma, left eye, severe stage 12/22/2020   Renal cyst, left 03/08/2018   Sciatica of left side    Scrotum pain 07/14/2023   Thrombocytopenia (HCC)  02/01/2018   Tinea unguium 05/22/2020   Unintentional weight loss of more than 10 pounds 08/31/2018   Visual loss, both eyes unqualified 12/27/2022   Well adult exam 03/30/2017    Past Surgical History:  Procedure Laterality Date   COLONOSCOPY  06/11/2013    Current Medications: Current Meds  Medication Sig   albuterol (VENTOLIN HFA) 108 (90 Base) MCG/ACT inhaler INHALE 1 PUFF INTO THE LUNGS EVERY 6 HOURS AS NEEDED FOR WHEEZING OR SHORTNESS OF BREATH.   aspirin EC 81 MG tablet Take 81 mg by mouth daily.    Cholecalciferol 5000 units TABS Take 5,000 mg by mouth daily.   dorzolamide-timolol (COSOPT) 22.3-6.8 MG/ML ophthalmic solution Place 1 drop into both eyes 2 (two) times daily.   doxycycline (VIBRA-TABS) 100 MG tablet Take 1 tablet (100 mg total) by mouth 2 (two) times daily.   EPINEPHrine 0.3 mg/0.3 mL IJ SOAJ injection Inject 0.3 mg into the muscle as needed for anaphylaxis. As needed for severe allergic reaction then call 911   lisinopril (ZESTRIL) 10 MG tablet Take 1 tablet (10 mg total) by mouth daily.   meloxicam (MOBIC) 15 MG tablet Take 1 tablet (15 mg total) by mouth daily.   metoprolol succinate (TOPROL-XL) 25 MG 24 hr tablet Take 25 mg by mouth daily.   montelukast (SINGULAIR) 10 MG tablet Take 1 tablet (10 mg total) by mouth daily.   RHOPRESSA 0.02 % SOLN Place 1 drop into both eyes daily.   rosuvastatin (CRESTOR) 10 MG tablet Take 1 tablet (10 mg total) by mouth daily. Patient needs an appointment for further refills. 3 rd/final attempt   tadalafil (CIALIS) 5 MG tablet Take 5 mg by mouth daily as needed for erectile dysfunction.   tamsulosin (FLOMAX) 0.4 MG CAPS capsule Take 2 capsules (0.8 mg total) by mouth daily.   traMADol (ULTRAM) 50 MG tablet Take 1-2 tabs every 6 hours as needed for moderate-severe pain. Not to exceed 4 tabs daily.   vitamin B-12 (CYANOCOBALAMIN) 1000 MCG tablet Take 1,000 mcg by mouth daily.   [DISCONTINUED] solifenacin (VESICARE) 10 MG tablet  Take 10 mg by mouth daily.   [DISCONTINUED] TRAVATAN Z 0.004 % SOLN ophthalmic solution Place 1 drop into both eyes at bedtime.     Allergies:   Brinzolamide-brimonidine   Social History   Socioeconomic History   Marital status: Married    Spouse name: Not on file   Number of children: 2   Years of education: 12   Highest education level: Not on file  Occupational History   Not on file  Tobacco Use   Smoking status: Never   Smokeless tobacco: Never  Vaping Use   Vaping status: Never Used  Substance and Sexual Activity   Alcohol use: No   Drug use: Yes   Sexual activity: Not Currently  Other Topics Concern   Not on file  Social History Narrative   Right handed   Drinks caffeine   Lives with wife   2 story home   Retired marine 2011   Social Drivers of Health   Financial Resource Strain: Low Risk  (10/09/2020)   Overall Financial Resource Strain (CARDIA)  Difficulty of Paying Living Expenses: Not hard at all  Food Insecurity: No Food Insecurity (10/09/2020)   Hunger Vital Sign    Worried About Running Out of Food in the Last Year: Never true    Ran Out of Food in the Last Year: Never true  Transportation Needs: No Transportation Needs (10/09/2020)   PRAPARE - Administrator, Civil Service (Medical): No    Lack of Transportation (Non-Medical): No  Physical Activity: Insufficiently Active (10/09/2020)   Exercise Vital Sign    Days of Exercise per Week: 2 days    Minutes of Exercise per Session: 30 min  Stress: No Stress Concern Present (10/09/2020)   [employer] of Occupational Health - Occupational Stress Questionnaire    Feeling of Stress : Not at all  Social Connections: Moderately Integrated (10/09/2020)   Social Connection and Isolation Panel    Frequency of Communication with Friends and Family: More than three times a week    Frequency of Social Gatherings with Friends and Family: More than three times a week    Attends Religious Services:  More than 4 times per year    Active Member of [employer] or Organizations: No    Attends Banker Meetings: Never    Marital Status: Married     Family History: The patient's family history includes Alzheimer's disease in his mother; Hypertension in his father; Liver cancer in his father.  ROS:   Please see the history of present illness.    All other systems reviewed and are negative.  EKGs/Labs/Other Studies Reviewed:    The following studies were reviewed today: I discussed my findings with the patient at length   Recent Labs: 10/06/2022: TSH 1.61 01/25/2023: ALT 12; BUN 17; Creatinine, Ser 1.28; Hemoglobin 14.5; Platelets 129.0; Potassium 4.0; Sodium 142  Recent Lipid Panel    Component Value Date/Time   CHOL 137 01/25/2023 0940   CHOL 124 06/21/2021 0835   TRIG 75.0 01/25/2023 0940   HDL 52.20 01/25/2023 0940   HDL 55 06/21/2021 0835   CHOLHDL 3 01/25/2023 0940   VLDL 15.0 01/25/2023 0940   LDLCALC 70 01/25/2023 0940   LDLCALC 55 06/21/2021 0835    Physical Exam:    VS:  BP 132/70   Pulse 60   Ht 6' 3 (1.905 m)   Wt 202 lb 12.8 oz (92 kg)   SpO2 98%   BMI 25.35 kg/m     Wt Readings from Last 3 Encounters:  09/28/23 202 lb 12.8 oz (92 kg)  07/14/23 200 lb (90.7 kg)  06/21/23 197 lb (89.4 kg)     GEN: Patient is in no acute distress HEENT: Normal NECK: No JVD; No carotid bruits LYMPHATICS: No lymphadenopathy CARDIAC: Hear sounds regular, 2/6 systolic murmur at the apex. RESPIRATORY:  Clear to auscultation without rales, wheezing or rhonchi  ABDOMEN: Soft, non-tender, non-distended MUSCULOSKELETAL:  No edema; No deformity  SKIN: Warm and dry NEUROLOGIC:  Alert and oriented x 3 PSYCHIATRIC:  Normal affect   Signed, Justin JONELLE Crape, MD  09/28/2023 11:03 AM    [facility]

## 2023-09-28 NOTE — Patient Instructions (Addendum)
 FDA-cleared personal EKG: The world's most clinically validated personal EKG, FDA-cleared to detect Atrial Fibrillation, Bradycardia, and Tachycardia. Catha is the most reliable way to check in on your heart from home. Take your EKG from anywhere: Capture a medical-grade EKG in 30 seconds and get an instant analysis right on your smartphone. Catha is small enough to fit in your pocket, so you can take it with you anywhere. Easy to use: Simply place your fingers on the sensors--no wires, patches, or gels. Recommended by doctors: A trusted resource, Catha is the #1 doctor-recommended personal EKG with more than 100 million EKGs recorded. Save or share your EKGs: With the press of a button, email your EKGs to your doctor or save them on your phone. Works with smartphones: Compatible with Event organiser and tablets. Check our compatibility chart. FSA/HSA eligible: Purchase using an FSA or HSA account (please confirm coverage with your insurance provider). Phone clip included with purchase, a $15 value. Conveniently take your device with you wherever you go.  [email or web address]   Step One- Record your EKG strip on [facility] app.   Step two- On Kardia EKG click "Download"   Step three- It will prompt you to make a password for this EKG. Please make the password "Revankar" so that we can view it.   Step four- Click on the little "upload" button (small box with an arrow in the middle) in the bottom left-hand corner of the screen.   Step five- Click "Save to Files"  Step six- Click on "On my iphone" and then "Pages" then press save in the top right-hand corner.   NOW GO TO MYCHART   Once on MyChart click "Messages"  Step one- Click "Send a message"  Step two- Click "Ask a medical question"   Step three- Click "Non urgent medical question"   Step four- Click on [name]'s name.  Step five- Click on the small  paperclip at the bottom of the screen  Step six- Click "Choose file"  Step seven- Pick the most recent EKG strip listed.   Once uploaded send the message!  Medication Instructions:  Your physician recommends that you continue on your current medications as directed. Please refer to the Current Medication list given to you today.  *If you need a refill on your cardiac medications before your next appointment, please call your pharmacy*   Lab Work: None ordered If you have labs (blood work) drawn today and your tests are completely normal, you will receive your results only by: MyChart Message (if you have MyChart) OR A paper copy in the mail If you have any lab test that is abnormal or we need to change your treatment, we will call you to review the results.   Testing/Procedures: A zio monitor was ordered today. It will remain on for 14 days. Remove 10/12/23. You will then return monitor and event diary in provided box. It takes 1-2 weeks for report to be downloaded and returned to us . We will call you with the results. If monitor falls off or has orange flashing light, please call Zio for further instructions.    Follow-Up: At [facility], you and your health needs are our priority.  As part of our continuing mission to provide you with exceptional heart care, we have created designated Provider Care Teams.  These Care Teams include your primary Cardiologist (physician) and Advanced Practice Providers (APPs -  Physician Assistants and Nurse Practitioners) who all work together to provide you  with the care you need, when you need it.  We recommend signing up for the patient portal called MyChart.  Sign up information is provided on this After Visit Summary.  MyChart is used to connect with patients for Virtual Visits (Telemedicine).  Patients are able to view lab/test results, encounter notes, upcoming appointments, etc.  Non-urgent messages can be sent to your provider as  well.   To learn more about what you can do with MyChart, go to [email or web address].    Your next appointment:   12 month(s)  The format for your next appointment:   In Person  Provider:   Jennifer Crape, MD    Other Instructions none  Important Information About Sugar

## 2023-09-29 ENCOUNTER — Other Ambulatory Visit: Payer: Self-pay | Admitting: Family Medicine

## 2023-09-29 DIAGNOSIS — J4530 Mild persistent asthma, uncomplicated: Secondary | ICD-10-CM

## 2023-10-23 DIAGNOSIS — I4729 Other ventricular tachycardia: Secondary | ICD-10-CM | POA: Diagnosis not present

## 2023-11-23 ENCOUNTER — Ambulatory Visit: Payer: Self-pay | Admitting: Cardiology

## 2023-11-23 DIAGNOSIS — I4729 Other ventricular tachycardia: Secondary | ICD-10-CM

## 2023-11-23 DIAGNOSIS — I471 Supraventricular tachycardia, unspecified: Secondary | ICD-10-CM

## 2023-11-23 NOTE — Progress Notes (Incomplete)
 Assessment/Plan:    Subjective Memory Complaints  Justin Frazier is a very pleasant 63 y.o. RH male with a history of hypertension, hyperlipidemia, asthma, BPH, HCM, glaucoma  presenting today in follow-up for evaluation of memory concerns.  MMSE today is *** without indication of cognitive decline. Patient is able to participate on ADLs and to drive without difficulties. Mood is *** ***.     Recommendations:   Follow up in 1 year  Recommend good control of cardiovascular risk factors Continue to control mood as per PCP    Subjective:   This patient is accompanied in the office by ***  who supplements the history. Previous records as well as any outside records available were reviewed prior to todays visit.   Patient was last seen on 11/24/22 with MoCA 25/30***.    Any changes in memory since last visit? . Patient has some difficulty remembering recent conversations and names of people. LTM  is good. Likes to play cards, read, watch movie and going to [place]  repeats oneself?  Endorsed Disoriented when walking into a room?  Patient denies ***  Misplacing objects?  Patient denies   Wandering behavior?   Denies. Any personality changes since last visit? Denies.   Any worsening depression?: denies.   Hallucinations or paranoia?  Denies.   Seizures?   Denies.    Any sleep changes? Sleeps well. Always had vivid dreams, denies REM behavior or sleepwalking   Sleep apnea?   denies ***  Any hygiene concerns?   Denies.   Independent of bathing and dressing?  Endorsed  Does the patient needs help with medications? Patient is in charge, has a pillbox. *** Who is in charge of the finances?  Patient is in charge   *** Any changes in appetite?  denies ***   Patient have trouble swallowing?  Denies.   Does the patient cook?  Any kitchen accidents such as leaving the stove on?   Denies.   Any headaches?    Denies.   Vision changes? Denies. Chronic pain?  Denies.   Ambulates with  difficulty?    Denies. ***  Recent falls or head injuries?    Denies.      Unilateral weakness, numbness or tingling?  Denies.   Any tremors?  Denies.   Any anosmia?    Denies.   Any incontinence of urine?  Denies.   Any bowel dysfunction?  Denies.      Patient lives .*** Does the patient drive?***  Initial visit 11/24/22   How long did patient have memory difficulties? For the last 6 months, especially with numbers or presenting new information bu given time, I remember it. 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  Long-term memory is good. Sometimes he does crossword puzzles. He likes to play cards, read, movies. He likes to go [employer].  repeats oneself?  Endorsed Disoriented when walking into a room?  Patient denies except occasionally not remembering what patient came to the room for.    Leaving objects in unusual places? Denies.   Wandering behavior?  denies .  Any personality changes?  Denies. 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  Denies   Seizures?  Denies    Any sleep changes?   Sleeps well, reports vivid dreams all my life, REM behavior or sleepwalking   Sleep apnea?  Denies   Any hygiene  concerns?  Denies   Independent of bathing and dressing?  Endorsed  Does the patient needs help with medications? Patient is in charge, has a pill box   Who is in charge of the finances? Patient is in charge   Any changes in appetite?  Denies     Patient have trouble swallowing? Denies.   Does the patient cook? Yes   Any kitchen accidents such as leaving the stove on? Denies.   Any history of headaches?  Denies.   Chronic pain ? Has some back pain and takes tramadol  prn with goo relief  Ambulates with difficulty?  Denies.   Recent falls or head injuries? Denies.When I was a child I hit my forehead with stiches  Vision changes?  He has glaucoma since age 44, under  control Unilateral weakness, numbness or tingling? Denies.   Any tremors?   Denies.   Any anosmia?  Denies.   Any incontinence of urine? He has to get up twice at night to urinate.  Any bowel dysfunction? Denies.      Patient lives with wife    History of heavy alcohol intake? Denies.   History of heavy tobacco use? Denies.   Family history of dementia? Mother had dementia, possible of vascular etiology. Does patient drive? Yes   Retired from [employer]  2011 and from the [employer].   MRI brain  10/2022,personally reviewed was normal, no acute findings.  Past Medical History:  Diagnosis Date   Abnormal EKG 03/20/2017   Adhesive capsulitis of left shoulder 07/26/2019   Allergic rhinitis    Apical variant hypertrophic cardiomyopathy (HCC) 05/27/2019   Asthma, currently active 12/27/2022   Benign essential hypertension 05/22/2020   Benign prostatic hyperplasia with weak urinary stream 09/03/2021   Carpal tunnel syndrome    Central serous retinopathy    CKD (chronic kidney disease), stage I    DOE (dyspnea on exertion) 03/30/2017   Dry eye syndrome 12/22/2020   Enlarged lymph node 01/16/2019   Glaucoma    HTN (hypertension) 03/20/2017   Hyperlipidemia 01/20/2020   Left groin pain 01/20/2020   Low back pain 05/22/2020   Lumbar strain    LVH (left ventricular hypertrophy) 03/20/2017   Mild intermittent asthma without complication 07/10/2018   Mild persistent asthma without complication 06/19/2020   Myopia 12/22/2020   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 Concern over right parotid mass. See HPI. Examination reveals a mobile 1 cm mass in the immediate right preauricular area.  No surrounding adenopathy.  Head and neck is otherwise negative for nodes. PLAN: We discussed most tumors of the parotid gland are benign.  Needle biopsy is helpful to determine the nature.   NSVT (nonsustained ventricular  tachycardia) (HCC)    Seen by Dr. Cesario (EP) recommended beta blocker therapy (2014)   Onychomycosis 05/22/2020   Primary open angle glaucoma of both eyes 12/22/2020   Primary open-angle glaucoma, bilateral, severe stage 12/22/2020   Primary open-angle glaucoma, left eye, severe stage 12/22/2020   Renal cyst, left 03/08/2018   Sciatica of left side    Scrotum pain 07/14/2023   Thrombocytopenia 02/01/2018   Tinea unguium 05/22/2020   Unintentional weight loss of more than 10 pounds 08/31/2018   Visual loss, both eyes unqualified 12/27/2022   Well adult exam 03/30/2017     Past Surgical History:  Procedure Laterality Date   COLONOSCOPY  06/11/2013  PREVIOUS MEDICATIONS:   CURRENT MEDICATIONS:  Outpatient Encounter Medications as of 11/24/2023  Medication Sig   albuterol  (VENTOLIN  HFA) 108 (90 Base) MCG/ACT inhaler INHALE 1 PUFF INTO THE LUNGS EVERY 6 HOURS AS NEEDED FOR WHEEZING OR SHORTNESS OF BREATH.   aspirin EC 81 MG tablet Take 81 mg by mouth daily.    Cholecalciferol 5000 units TABS Take 5,000 mg by mouth daily.   dorzolamide-timolol (COSOPT) 22.3-6.8 MG/ML ophthalmic solution Place 1 drop into both eyes 2 (two) times daily.   doxycycline  (VIBRA -TABS) 100 MG tablet Take 1 tablet (100 mg total) by mouth 2 (two) times daily.   EPINEPHrine  0.3 mg/0.3 mL IJ SOAJ injection Inject 0.3 mg into the muscle as needed for anaphylaxis. As needed for severe allergic reaction then call 911   lisinopril  (ZESTRIL ) 10 MG tablet Take 1 tablet (10 mg total) by mouth daily.   meloxicam  (MOBIC ) 15 MG tablet Take 1 tablet (15 mg total) by mouth daily.   metoprolol  succinate (TOPROL -XL) 25 MG 24 hr tablet Take 25 mg by mouth daily.   montelukast  (SINGULAIR ) 10 MG tablet TAKE 1 TABLET BY MOUTH EVERY DAY   RHOPRESSA 0.02 % SOLN Place 1 drop into both eyes daily.   rosuvastatin  (CRESTOR ) 10 MG tablet Take 1 tablet (10 mg total) by mouth daily. Patient needs an appointment for further refills. 3  rd/final attempt   tadalafil (CIALIS) 5 MG tablet Take 5 mg by mouth daily as needed for erectile dysfunction.   tamsulosin  (FLOMAX ) 0.4 MG CAPS capsule Take 2 capsules (0.8 mg total) by mouth daily.   traMADol  (ULTRAM ) 50 MG tablet Take 1-2 tabs every 6 hours as needed for moderate-severe pain. Not to exceed 4 tabs daily.   vitamin B-12 (CYANOCOBALAMIN ) 1000 MCG tablet Take 1,000 mcg by mouth daily.   No facility-administered encounter medications on file as of 11/24/2023.     Objective:     PHYSICAL EXAMINATION:    VITALS:  There were no vitals filed for this visit.  GEN:  The patient appears stated age and is in NAD. HEENT:  Normocephalic, atraumatic.   Neurological examination:  General: NAD, well-groomed, appears stated age. Orientation: The patient is alert. Oriented to person, place and not to date.*** Cranial nerves: There is good facial symmetry.The speech is fluent and clear. No aphasia or dysarthria. Fund of knowledge is appropriate. Recent memory impaired and remote memory is normal.  Attention and concentration are normal.  Able to name objects and repeat phrases.  Hearing is intact to conversational tone ***.   Delayed recall *** Sensation: Sensation is intact to light touch throughout Motor: Strength is at least antigravity x4. DTR's 2/4 in UE/LE      11/24/2022    8:00 AM  Montreal Cognitive Assessment   Visuospatial/ Executive (0/5) 3  Naming (0/3) 3  Attention: Read list of digits (0/2) 2  Attention: Read list of letters (0/1) 1  Attention: Serial 7 subtraction starting at 100 (0/3) 1  Language: Repeat phrase (0/2) 2  Language : Fluency (0/1) 1  Abstraction (0/2) 1  Delayed Recall (0/5) 4  Orientation (0/6) 6  Total 24  Adjusted Score (based on education) 25        No data to display             Movement examination: Tone: There is normal tone in the UE/LE Abnormal movements:  no tremor.  No myoclonus.  No asterixis.   Coordination:  There is  no decremation with RAM's.  Normal finger to nose  Gait and Station: The patient has no difficulty arising out of a deep-seated chair without the use of the hands. The patient's stride length is good.  Gait is cautious and narrow.   Thank you for allowing us  the opportunity to participate in the care of this nice patient. Please do not hesitate to contact us  for any questions or concerns.   Total time spent on today's visit was *** minutes dedicated to this patient today, preparing to see patient, examining the patient, ordering tests and/or medications and counseling the patient, documenting clinical information in the EHR or other health record, independently interpreting results and communicating results to the patient/family, discussing treatment and goals, answering patient's questions and coordinating care.  Cc:  Frann Mabel Mt, DO  [facility] 11/23/2023 4:39 PM

## 2023-11-24 ENCOUNTER — Ambulatory Visit: Payer: Medicare Other | Admitting: Physician Assistant

## 2023-11-24 ENCOUNTER — Encounter: Payer: Self-pay | Admitting: Physician Assistant

## 2023-11-24 ENCOUNTER — Other Ambulatory Visit: Payer: Self-pay | Admitting: Family Medicine

## 2023-11-24 DIAGNOSIS — N401 Enlarged prostate with lower urinary tract symptoms: Secondary | ICD-10-CM

## 2023-11-29 NOTE — Telephone Encounter (Signed)
 Orders placed.

## 2023-12-03 NOTE — Progress Notes (Unsigned)
 Electrophysiology Office Note:   Date:  12/04/2023  ID:  [name], DOB 19-Nov-1960, MRN [id number]  Primary Cardiologist: None Primary Heart Failure: None Electrophysiologist: Devone Bonilla Gladis Norton, MD      History of Present Illness:   CHANCELLOR VANDERLOOP is a 63 y.o. male with h/o apical variant hypertrophic cardiomyopathy, hypertension, nonsustained VT, stage I CKD seen today for  for Electrophysiology evaluation of hypertrophic cardiomyopathy at the request of [name].    He previously wore a cardiac monitor that showed 2 runs of nonsustained VT up to 12 beats.  He has not had any episodes of syncope or near syncope since.  Cardiac MRI showed a 5% LGE burden and no LV apical aneurysm.  He recently presented to his primary cardiologist with palpitations.  He wore a cardiac monitor that showed multiple SVT episodes, longest lasting 30 minutes.  He also had 14 VT episodes, longest lasting 14 seconds.  Discussed the use of AI scribe software for clinical note transcription with the patient, who gave verbal consent to proceed.  History of Present Illness HULBERT BRANSCOME is a 63 year old male with heart arrhythmias who presents with heart palpitations. He was referred by Bambi Myron after experiencing heart palpitations.  He experiences heart palpitations, which he associates with coffee consumption. He was drinking at least sixteen ounces of strong coffee daily. After discussing with Dr. Revankar, he stopped drinking coffee about a month ago, leading to a significant improvement in symptoms. The palpitations were not frequent but noticeable, especially after eating a large meal.  He stopped drinking coffee after his cardiac monitor.  He has a history of arrhythmias from both the top and bottom chambers of the heart, according to his previous test results. An MRI showed a heart muscle thickness of two centimeters and minimal scar tissue.  No episodes of syncope or near-syncope. He  is more aware of his heart when sitting quietly or lying down to sleep, but these rhythms do not limit his activities.  He has been using a heart monitor, which recorded episodes of increased heart rate, with one episode lasting thirty minutes and reaching an average of 131 beats per minute.  Since stopping caffeine his heart rate varies as measured on his smart watch, sometimes reaching up to 100 beats per minute, but typically averages between 60 and 80 beats per minute.    Review of systems complete and found to be negative unless listed in HPI.   EP Information / Studies Reviewed:    EKG is ordered today. Personal review as below.  EKG Interpretation Date/Time:  Monday December 04 2023 15:48:13 EDT Ventricular Rate:  50 PR Interval:  166 QRS Duration:  112 QT Interval:  428 QTC Calculation: 390 R Axis:   -35  Text Interpretation: Sinus bradycardia Left axis deviation Moderate voltage criteria for LVH, may be normal variant ( R in aVL , Cornell product ) Marked ST abnormality, possible anterior subendocardial injury When compared with ECG of 28-Dec-2022 15:06, Premature supraventricular complexes are no longer Present Confirmed by Dyson Sevey ([id number]) on 12/04/2023 3:50:19 PM     Risk Assessment/Calculations:           Physical Exam:   VS:  BP (!) 140/80   Pulse (!) 50   Ht 6' 3 (1.905 m)   Wt 200 lb 1.3 oz (90.8 kg)   SpO2 99%   BMI 25.01 kg/m    Wt Readings from Last 3 Encounters:  12/04/23 200 lb 1.3  oz (90.8 kg)  09/28/23 202 lb 12.8 oz (92 kg)  07/14/23 200 lb (90.7 kg)     GEN: Well nourished, well developed in no acute distress NECK: No JVD; No carotid bruits CARDIAC: Regular rate and rhythm, no murmurs, rubs, gallops RESPIRATORY:  Clear to auscultation without rales, wheezing or rhonchi  ABDOMEN: Soft, non-tender, non-distended EXTREMITIES:  No edema; No deformity   ASSESSMENT AND PLAN:    1.  Apical variant hypertrophic cardiomyopathy: Cardiac MRI with  a 19 mm apex and no evidence of apical aneurysm.  Less than 5% LGE.  Has had nonsustained VT but no syncope or near syncope.  He was seen 3 years ago and declined ICD.  2.  Hypertension: Mildly elevated.  Usually well-controlled.  No changes.  3.  Nonsustained VT:  4.  SVT:  He has had episodes of both nonsustained VT and SVT noted on his cardiac monitor.  He has had significant improvement in his symptoms since stopping caffeine.  He does understand that he has a elevated risk of significant cardiac morbidity based on his nonsustained VT, but he does not have other risk factors for this based on his hypertrophic cardiomyopathy.  As he feels improved since not drinking caffeine, he wishes to wear another monitor to see if this is helped with his arrhythmia.  We did discuss the possibility of EP study to further delineate his arrhythmias.  Chasya Zenz touch base after his second monitor.  Follow up with Dr. Inocencio post cardiac monitor   Signed, Yelina Sarratt Gladis Inocencio, MD

## 2023-12-04 ENCOUNTER — Ambulatory Visit: Attending: Cardiology

## 2023-12-04 ENCOUNTER — Encounter: Payer: Self-pay | Admitting: Cardiology

## 2023-12-04 ENCOUNTER — Ambulatory Visit: Attending: Cardiology | Admitting: Cardiology

## 2023-12-04 VITALS — BP 140/80 | HR 50 | Ht 75.0 in | Wt 200.1 lb

## 2023-12-04 DIAGNOSIS — I10 Essential (primary) hypertension: Secondary | ICD-10-CM | POA: Diagnosis not present

## 2023-12-04 DIAGNOSIS — I471 Supraventricular tachycardia, unspecified: Secondary | ICD-10-CM

## 2023-12-04 DIAGNOSIS — R002 Palpitations: Secondary | ICD-10-CM | POA: Diagnosis not present

## 2023-12-04 DIAGNOSIS — I4729 Other ventricular tachycardia: Secondary | ICD-10-CM | POA: Diagnosis not present

## 2023-12-04 DIAGNOSIS — I422 Other hypertrophic cardiomyopathy: Secondary | ICD-10-CM | POA: Diagnosis not present

## 2023-12-04 NOTE — Patient Instructions (Signed)
 Medication Instructions:  Your physician recommends that you continue on your current medications as directed. Please refer to the Current Medication list given to you today.  *If you need a refill on your cardiac medications before your next appointment, please call your pharmacy*  Lab Work: None ordered   Testing/Procedures:                           ZIO XT- Long Term Monitor Instructions  Your physician has requested you wear a ZIO patch monitor for 14 days.  This is a single patch monitor. [employer] supplies one patch monitor per enrollment. Additional stickers are not available. Please do not apply patch if you will be having a Nuclear Stress Test,  Echocardiogram, Cardiac CT, MRI, or Chest Xray during the period you would be wearing the  monitor. The patch cannot be worn during these tests. You cannot remove and re-apply the  ZIO XT patch monitor.  Your ZIO patch monitor will be mailed 3 day USPS to your address on file. It may take 3-5 days  to receive your monitor after you have been enrolled.  Once you have received your monitor, please review the enclosed instructions. Your monitor  has already been registered assigning a specific monitor serial # to you.  Billing and Patient Assistance Program Information  We have supplied [employer] with any of your insurance information on file for billing purposes. [employer] offers a sliding scale Patient Assistance Program for patients that do not have  insurance, or whose insurance does not completely cover the cost of the ZIO monitor.  You must apply for the Patient Assistance Program to qualify for this discounted rate.  To apply, please call [employer] at [phone], select option 4, select option 2, ask to apply for  Patient Assistance Program. Meredeth will ask your household income, and how many people  are in your household. They will quote your out-of-pocket cost based on that information.  [employer] will also be able to set up a  38-month, interest-free payment plan if needed.  Applying the monitor   Shave hair from upper left chest.  Hold abrader disc by orange tab. Rub abrader in 40 strokes over the upper left chest as  indicated in your monitor instructions.  Clean area with 4 enclosed alcohol pads. Let dry.  Apply patch as indicated in monitor instructions. Patch will be placed under collarbone on left  side of chest with arrow pointing upward.  Rub patch adhesive wings for 2 minutes. Remove white label marked 1. Remove the white  label marked 2. Rub patch adhesive wings for 2 additional minutes.  While looking in a mirror, press and release button in center of patch. A small green light will  flash 3-4 times. This will be your only indicator that the monitor has been turned on.  Do not shower for the first 24 hours. You may shower after the first 24 hours.  Press the button if you feel a symptom. You will hear a small click. Record Date, Time and  Symptom in the Patient Logbook.  When you are ready to remove the patch, follow instructions on the last 2 pages of Patient  Logbook. Stick patch monitor onto the last page of Patient Logbook.  Place Patient Logbook in the blue and white box. Use locking tab on box and tape box closed  securely. The blue and white box has prepaid postage on it. Please place it in the mailbox  as  soon as possible. Your physician should have your test results approximately 7 days after the  monitor has been mailed back to [facility].  Call [facility] Customer Care at [phone] if you have questions regarding  your ZIO XT patch monitor. Call them immediately if you see an orange light blinking on your  monitor.  If your monitor falls off in less than 4 days, contact our Monitor department at [phone].  If your monitor becomes loose or falls off after 4 days call [employer] at [phone] for  suggestions on securing your monitor    Follow-Up: At [facility], you and your health needs are our priority.  As part of our continuing mission to provide you with exceptional heart care, our providers are all part of one team.  This team includes your primary Cardiologist (physician) and Advanced Practice Providers or APPs (Physician Assistants and Nurse Practitioners) who all work together to provide you with the care you need, when you need it.  Your next appointment:   To be determined after the monitor has been completed & reviewed  Provider:   Soyla Norton, MD     Thank you for choosing [facility]!!   Maeola Domino, RN [phone]

## 2023-12-04 NOTE — Progress Notes (Unsigned)
 Enrolled for [employer] to mail a ZIO XT long term holter monitor to the patients address on file.

## 2023-12-21 ENCOUNTER — Other Ambulatory Visit: Payer: Self-pay | Admitting: Pharmacist

## 2023-12-21 ENCOUNTER — Encounter: Payer: Self-pay | Admitting: Pharmacist

## 2023-12-21 MED ORDER — ROSUVASTATIN CALCIUM 10 MG PO TABS: 10.0000 mg | ORAL_TABLET | Freq: Every day | ORAL | 0 refills | Status: DC

## 2023-12-21 NOTE — Progress Notes (Signed)
 Pharmacy Quality Measure Review  This patient is appearing on a report for being at risk of failing the adherence measure for cholesterol (statin) medications this calendar year.   Medication: rosuvastatin  10mg  Last fill date: 09/28/2023 for 90 day supply. No refills remaining.   Patient 's last annual visit with PCP was December 2024 - he will be due to have labs soon. Will refill rosuvastatin  x 1 and send request to scheduling team to outreach patient to schedule follow up in December 2025 / January 2026.    Madelin Ray, PharmD Clinical Pharmacist [facility] Primary [facility]  Population Health [phone]

## 2023-12-22 NOTE — Progress Notes (Signed)
 Lvm for pt to call and schedule with pcp

## 2024-01-08 DIAGNOSIS — R002 Palpitations: Secondary | ICD-10-CM | POA: Diagnosis not present

## 2024-01-10 ENCOUNTER — Ambulatory Visit: Payer: Self-pay | Admitting: Cardiology

## 2024-03-03 ENCOUNTER — Other Ambulatory Visit: Payer: Self-pay | Admitting: Family Medicine

## 2024-03-03 DIAGNOSIS — I10 Essential (primary) hypertension: Secondary | ICD-10-CM

## 2024-03-11 ENCOUNTER — Encounter: Admitting: Family Medicine

## 2024-03-18 ENCOUNTER — Encounter: Admitting: Family Medicine

## 2024-03-22 ENCOUNTER — Ambulatory Visit: Admitting: Family Medicine

## 2024-03-22 ENCOUNTER — Encounter: Payer: Self-pay | Admitting: Family Medicine

## 2024-03-22 ENCOUNTER — Ambulatory Visit: Payer: Self-pay | Admitting: Family Medicine

## 2024-03-22 VITALS — BP 134/78 | HR 89 | Temp 98.0°F | Resp 16 | Ht 75.0 in | Wt 199.2 lb

## 2024-03-22 DIAGNOSIS — Z0000 Encounter for general adult medical examination without abnormal findings: Secondary | ICD-10-CM

## 2024-03-22 DIAGNOSIS — Z125 Encounter for screening for malignant neoplasm of prostate: Secondary | ICD-10-CM

## 2024-03-22 LAB — COMPREHENSIVE METABOLIC PANEL WITH GFR
ALT: 12 U/L (ref 3–53)
AST: 19 U/L (ref 5–37)
Albumin: 4.1 g/dL (ref 3.5–5.2)
Alkaline Phosphatase: 62 U/L (ref 39–117)
BUN: 15 mg/dL (ref 6–23)
CO2: 28 meq/L (ref 19–32)
Calcium: 9.3 mg/dL (ref 8.4–10.5)
Chloride: 106 meq/L (ref 96–112)
Creatinine, Ser: 1.2 mg/dL (ref 0.40–1.50)
GFR: 64.23 mL/min
Glucose, Bld: 82 mg/dL (ref 70–99)
Potassium: 4 meq/L (ref 3.5–5.1)
Sodium: 143 meq/L (ref 135–145)
Total Bilirubin: 0.9 mg/dL (ref 0.2–1.2)
Total Protein: 6.6 g/dL (ref 6.0–8.3)

## 2024-03-22 LAB — LIPID PANEL
Cholesterol: 137 mg/dL (ref 28–200)
HDL: 45.2 mg/dL
LDL Cholesterol: 72 mg/dL (ref 10–99)
NonHDL: 91.87
Total CHOL/HDL Ratio: 3
Triglycerides: 97 mg/dL (ref 10.0–149.0)
VLDL: 19.4 mg/dL (ref 0.0–40.0)

## 2024-03-22 LAB — CBC
HCT: 42.7 % (ref 39.0–52.0)
Hemoglobin: 14.2 g/dL (ref 13.0–17.0)
MCHC: 33.1 g/dL (ref 30.0–36.0)
MCV: 94.5 fl (ref 78.0–100.0)
Platelets: 124 10*3/uL — ABNORMAL LOW (ref 150.0–400.0)
RBC: 4.52 Mil/uL (ref 4.22–5.81)
RDW: 13.8 % (ref 11.5–15.5)
WBC: 3.9 10*3/uL — ABNORMAL LOW (ref 4.0–10.5)

## 2024-03-22 LAB — PSA: PSA: 6.89 ng/mL — ABNORMAL HIGH (ref 0.10–4.00)

## 2024-03-22 MED ORDER — ROSUVASTATIN CALCIUM 10 MG PO TABS: 10.0000 mg | ORAL_TABLET | Freq: Every day | ORAL | 3 refills | Status: AC

## 2024-03-22 NOTE — Progress Notes (Signed)
 Chief Complaint  Patient presents with   Annual Exam    CPE    Well Male Justin Frazier is here for a complete physical.   His last physical was >1 year ago.  Current diet: in general, a healthy diet.  Current exercise: strength training, walking Weight trend: stable Fatigue out of ordinary? No. Seat belt? Yes.   Advanced directive? No  Health maintenance Shingrix- No Colonoscopy- Yes Tetanus- Yes HIV- Yes Hep C- Yes   Past Medical History:  Diagnosis Date   Abnormal EKG 03/20/2017   Adhesive capsulitis of left shoulder 07/26/2019   Allergic rhinitis    Apical variant hypertrophic cardiomyopathy (HCC) 05/27/2019   Asthma, currently active 12/27/2022   Benign essential hypertension 05/22/2020   Benign prostatic hyperplasia with weak urinary stream 09/03/2021   Carpal tunnel syndrome    Central serous retinopathy    CKD (chronic kidney disease), stage I    DOE (dyspnea on exertion) 03/30/2017   Dry eye syndrome 12/22/2020   Enlarged lymph node 01/16/2019   Glaucoma    HTN (hypertension) 03/20/2017   Hyperlipidemia 01/20/2020   Left groin pain 01/20/2020   Low back pain 05/22/2020   Lumbar strain    LVH (left ventricular hypertrophy) 03/20/2017   Mild intermittent asthma without complication 07/10/2018   Mild persistent asthma without complication 06/19/2020   Myopia 12/22/2020   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 Concern over right parotid mass. See HPI. Examination reveals a mobile 1 cm mass in the immediate right preauricular area.  No surrounding adenopathy.  Head and neck is otherwise negative for nodes. PLAN: We discussed most tumors of the parotid gland are benign.  Needle biopsy is helpful to determine the nature.   NSVT (nonsustained ventricular tachycardia) (HCC)    Seen by Dr. Cesario (EP) recommended beta blocker therapy (2014)   Onychomycosis 05/22/2020   Primary  open angle glaucoma of both eyes 12/22/2020   Primary open-angle glaucoma, bilateral, severe stage 12/22/2020   Primary open-angle glaucoma, left eye, severe stage 12/22/2020   Renal cyst, left 03/08/2018   Sciatica of left side    Scrotum pain 07/14/2023   Thrombocytopenia 02/01/2018   Tinea unguium 05/22/2020   Unintentional weight loss of more than 10 pounds 08/31/2018   Visual loss, both eyes unqualified 12/27/2022   Well adult exam 03/30/2017      Past Surgical History:  Procedure Laterality Date   COLONOSCOPY  06/11/2013    Medications  Medications Ordered Prior to Encounter[1]   Allergies Allergies[2]  Family History Family History  Problem Relation Age of Onset   Alzheimer's disease Mother    Hypertension Father    Liver cancer Father     Review of Systems: Constitutional:  no fevers Eye:  no recent significant change in vision Ear/Nose/Mouth/Throat:  Ears:  no hearing loss Nose/Mouth/Throat:  no complaints of nasal congestion, no sore throat Cardiovascular:  no chest pain Respiratory:  no shortness of breath Gastrointestinal:  no change in bowel habits GU:  Male: negative for dysuria, frequency Musculoskeletal/Extremities:  no joint pain Integumentary (Skin/Breast):  no abnormal skin lesions reported Neurologic:  no headaches Endocrine: No unexpected weight changes Hematologic/Lymphatic:  no abnormal bleeding  Exam BP 134/78 (BP Location: Left Arm, Patient Position: Sitting)   Pulse 89   Temp 98 F (36.7 C) (Oral)   Resp 16   Ht 6' 3 (1.905 m)   Wt  199 lb 3.2 oz (90.4 kg)   SpO2 98%   BMI 24.90 kg/m  General:  well developed, well nourished, in no apparent distress Skin:  no significant moles, warts, or growths Head:  no masses, lesions, or tenderness Eyes:  pupils equal and round, sclera anicteric without injection Ears:  canals without lesions, TMs shiny without retraction, no obvious effusion, no erythema Nose:  nares patent, mucosa  normal Throat/Pharynx:  lips and gingiva without lesion; tongue and uvula midline; non-inflamed pharynx; no exudates or postnasal drainage Neck: neck supple without adenopathy, thyromegaly, or masses Cardiac: RRR, no bruits, no LE edema Lungs:  clear to auscultation, breath sounds equal bilaterally, no respiratory distress Abdomen: BS+, soft, non-tender, non-distended, no masses or organomegaly noted Rectal: Deferred Musculoskeletal:  symmetrical muscle groups noted without atrophy or deformity Neuro:  gait normal; deep tendon reflexes normal and symmetric Psych: well oriented with normal range of affect and appropriate judgment/insight  Assessment and Plan  Well adult exam - Plan: CBC, Comprehensive metabolic panel with GFR, Lipid panel  Screening for prostate cancer - Plan: PSA   Well 64 y.o. male. Counseled on diet and exercise. Counseled on risks and benefits of prostate cancer screening with PSA. The patient agrees to undergo testing. Flu shot, PCV 20, Shingrix politely declined.  Discussed the benefits of the latter 2.  He will think about it. Advanced directive form provided today.  Immunizations, labs, and further orders as above. Follow up in 6 mo. The patient voiced understanding and agreement to the plan.  Mabel [place], DO 03/22/24 8:33 AM     [1]  Current Outpatient Medications on File Prior to Visit  Medication Sig Dispense Refill   albuterol  (VENTOLIN  HFA) 108 (90 Base) MCG/ACT inhaler INHALE 1 PUFF INTO THE LUNGS EVERY 6 HOURS AS NEEDED FOR WHEEZING OR SHORTNESS OF BREATH. 8.5 each 2   aspirin EC 81 MG tablet Take 81 mg by mouth daily.      Cholecalciferol 5000 units TABS Take 5,000 mg by mouth daily.     dorzolamide-timolol (COSOPT) 22.3-6.8 MG/ML ophthalmic solution Place 1 drop into both eyes 2 (two) times daily.     EPINEPHrine  0.3 mg/0.3 mL IJ SOAJ injection Inject 0.3 mg into the muscle as needed for anaphylaxis. As needed for severe allergic  reaction then call 911 2 each 1   lisinopril  (ZESTRIL ) 10 MG tablet TAKE 1 TABLET BY MOUTH EVERY DAY 90 tablet 3   metoprolol  succinate (TOPROL -XL) 25 MG 24 hr tablet Take 25 mg by mouth daily.     montelukast  (SINGULAIR ) 10 MG tablet TAKE 1 TABLET BY MOUTH EVERY DAY 90 tablet 2   RHOPRESSA 0.02 % SOLN Place 1 drop into both eyes daily.     tadalafil (CIALIS) 5 MG tablet Take 5 mg by mouth daily as needed for erectile dysfunction.     tamsulosin  (FLOMAX ) 0.4 MG CAPS capsule TAKE 2 CAPSULES BY MOUTH EVERY DAY 180 capsule 3   traMADol  (ULTRAM ) 50 MG tablet Take 1-2 tabs every 6 hours as needed for moderate-severe pain. Not to exceed 4 tabs daily. 90 tablet 0   vitamin B-12 (CYANOCOBALAMIN ) 1000 MCG tablet Take 1,000 mcg by mouth daily.     No current facility-administered medications on file prior to visit.  [2]  Allergies Allergen Reactions   Brinzolamide-Brimonidine     Other reaction(s): Itching of eye, Red eye, Itching of eye, Red eye

## 2024-03-22 NOTE — Patient Instructions (Addendum)
 Give us  2-3 business days to get the results of your labs back.   Keep the diet clean and stay active.  Please get me a copy of your advanced directive form at your convenience.   Please consider getting the pneumonia vaccine. This can protect against hospitalization and death associated with pneumonia.   The Shingrix vaccine (for shingles) is a 2 shot series spaced 2-6 months apart. It can make people feel low energy, achy and almost like they have the flu for 48 hours after injection. 1/5 people can have nausea and/or vomiting. Please plan accordingly when deciding on when to get this shot. Call our office for a nurse visit appointment to get this. The second shot of the series is less severe regarding the side effects, but it still lasts 48 hours.   Let us  know if you need anything.
